# Patient Record
Sex: Female | Born: 1990
Health system: Southern US, Community
[De-identification: ages and names within clinical notes are randomized; demographics above are authoritative.]

## PROBLEM LIST (undated history)

## (undated) DIAGNOSIS — R002 Palpitations: Secondary | ICD-10-CM

## (undated) DIAGNOSIS — K219 Gastro-esophageal reflux disease without esophagitis: Secondary | ICD-10-CM

## (undated) DIAGNOSIS — T7840XA Allergy, unspecified, initial encounter: Secondary | ICD-10-CM

## (undated) DIAGNOSIS — Z0000 Encounter for general adult medical examination without abnormal findings: Secondary | ICD-10-CM

## (undated) DIAGNOSIS — A0811 Acute gastroenteropathy due to Norwalk agent: Secondary | ICD-10-CM

## (undated) DIAGNOSIS — E785 Hyperlipidemia, unspecified: Secondary | ICD-10-CM

## (undated) DIAGNOSIS — M791 Myalgia, unspecified site: Secondary | ICD-10-CM

## (undated) HISTORY — DX: Allergy, unspecified, initial encounter: T78.40XA

## (undated) HISTORY — DX: Myalgia, unspecified site: M79.10

## (undated) HISTORY — DX: Gastro-esophageal reflux disease without esophagitis: K21.9

## (undated) HISTORY — DX: Hyperlipidemia, unspecified: E78.5

## (undated) HISTORY — DX: Acute gastroenteropathy due to Norwalk agent: A08.11

## (undated) HISTORY — PX: WISDOM TOOTH EXTRACTION: SHX21

## (undated) HISTORY — DX: Encounter for general adult medical examination without abnormal findings: Z00.00

## (undated) HISTORY — DX: Palpitations: R00.2

---
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
## 2015-08-29 LAB — HM PAP SMEAR: HM Pap smear: NORMAL

## 2016-08-16 ENCOUNTER — Telehealth: Payer: Self-pay | Admitting: Behavioral Health

## 2016-08-16 ENCOUNTER — Encounter: Payer: Self-pay | Admitting: Behavioral Health

## 2016-08-16 NOTE — Telephone Encounter (Signed)
Pre-Visit Call completed with patient and chart updated.   Pre-Visit Info documented in Specialty Comments under Snap[employer]ot.    

## 2016-08-17 ENCOUNTER — Encounter: Payer: Self-pay | Admitting: Family Medicine

## 2016-08-17 ENCOUNTER — Telehealth: Payer: Self-pay | Admitting: Obstetrics & Gynecology

## 2016-08-17 ENCOUNTER — Ambulatory Visit (INDEPENDENT_AMBULATORY_CARE_PROVIDER_SITE_OTHER): Payer: 59 | Admitting: Family Medicine

## 2016-08-17 VITALS — BP 92/68 | HR 96 | Temp 98.9°F | Ht 73.0 in | Wt 184.2 lb

## 2016-08-17 DIAGNOSIS — Z309 Encounter for contraceptive management, unspecified: Secondary | ICD-10-CM | POA: Diagnosis not present

## 2016-08-17 DIAGNOSIS — M791 Myalgia, unspecified site: Secondary | ICD-10-CM | POA: Insufficient documentation

## 2016-08-17 DIAGNOSIS — E782 Mixed hyperlipidemia: Secondary | ICD-10-CM

## 2016-08-17 DIAGNOSIS — Z0000 Encounter for general adult medical examination without abnormal findings: Secondary | ICD-10-CM | POA: Insufficient documentation

## 2016-08-17 DIAGNOSIS — T7840XA Allergy, unspecified, initial encounter: Secondary | ICD-10-CM | POA: Diagnosis not present

## 2016-08-17 DIAGNOSIS — K219 Gastro-esophageal reflux disease without esophagitis: Secondary | ICD-10-CM

## 2016-08-17 HISTORY — DX: Allergy, unspecified, initial encounter: T78.40XA

## 2016-08-17 HISTORY — DX: Myalgia, unspecified site: M79.10

## 2016-08-17 HISTORY — DX: Encounter for general adult medical examination without abnormal findings: Z00.00

## 2016-08-17 HISTORY — DX: Gastro-esophageal reflux disease without esophagitis: K21.9

## 2016-08-17 LAB — COMPREHENSIVE METABOLIC PANEL
ALT: 26 U/L (ref 0–35)
AST: 25 U/L (ref 0–37)
Albumin: 4.5 g/dL (ref 3.5–5.2)
Alkaline Phosphatase: 46 U/L (ref 39–117)
BUN: 10 mg/dL (ref 6–23)
CO2: 29 mEq/L (ref 19–32)
Calcium: 9.9 mg/dL (ref 8.4–10.5)
Chloride: 105 mEq/L (ref 96–112)
Creatinine, Ser: 0.96 mg/dL (ref 0.40–1.20)
GFR: 74.2 mL/min (ref >60.00)
Glucose, Bld: 88 mg/dL (ref 70–99)
Potassium: 4.7 mEq/L (ref 3.5–5.1)
Sodium: 138 mEq/L (ref 135–145)
Total Bilirubin: 0.4 mg/dL (ref 0.2–1.2)
Total Protein: 7.7 g/dL (ref 6.0–8.3)

## 2016-08-17 LAB — LIPID PANEL
Cholesterol: 168 mg/dL (ref 0–200)
HDL: 57.9 mg/dL (ref >39.00)
LDL Cholesterol: 79 mg/dL (ref 0–99)
NonHDL: 110.32
Total CHOL/HDL Ratio: 3
Triglycerides: 156 mg/dL — ABNORMAL HIGH (ref 0.0–149.0)
VLDL: 31.2 mg/dL (ref 0.0–40.0)

## 2016-08-17 LAB — MAGNESIUM: Magnesium: 2.1 mg/dL (ref 1.5–2.5)

## 2016-08-17 LAB — CBC
HCT: 42.8 % (ref 36.0–46.0)
Hemoglobin: 14.9 g/dL (ref 12.0–15.0)
MCHC: 34.9 g/dL (ref 30.0–36.0)
MCV: 88.7 fl (ref 78.0–100.0)
Platelets: 317 10*3/uL (ref 150.0–400.0)
RBC: 4.83 Mil/uL (ref 3.87–5.11)
RDW: 12.8 % (ref 11.5–15.5)
WBC: 8.4 10*3/uL (ref 4.0–10.5)

## 2016-08-17 LAB — TSH: TSH: 1.83 u[IU]/mL (ref 0.35–4.50)

## 2016-08-17 NOTE — Assessment & Plan Note (Signed)
Check a magnesium level today

## 2016-08-17 NOTE — Assessment & Plan Note (Signed)
Uses Allegra most of the year. Then on a bad day will take a second daily. Uses Flonase and nasal saline prn. Will add Singulair as needed.

## 2016-08-17 NOTE — Progress Notes (Signed)
Patient ID: Jenna Rhodes, female   DOB: 04-03-1991, 25 y.o.   MRN: [id number]   Subjective:    Patient ID: Jenna Rhodes, female    DOB: [birth year], 25 y.o.   MRN: [id number]  Chief Complaint  Patient presents with  . Establish Care    HPI Patient is in today for new patient appointments and annual preventative exam. She is feeling well or acute illness. PMH is only remarkable for some seasonal allergies. Denies CP/palp/SOB/HA/congestion/fevers/GI or GU c/o. Taking meds as prescribed  Past Medical History:  Diagnosis Date  . Allergic state 08/17/2016  . Allergy    pt. reported seasonal allergies  . Encounter for screening and preventative care 08/17/2016  . Gastro-esophageal reflux 08/17/2016  . Hyperlipidemia 08/30/2016  . Myalgia 08/17/2016    Past Surgical History:  Procedure Laterality Date  . MANDIBLE SURGERY  08/28/2002    Family History  Problem Relation Age of Onset  . Parkinson's disease Father   . Diabetes Maternal Grandmother   . Cancer Maternal Grandmother     breast   . Hypertension Maternal Grandmother   . Cancer Paternal Grandmother     stomach, lymphoma  . Neuropathy Paternal Grandfather   . Cancer Paternal Grandfather     prostate  . Hearing loss Paternal Grandfather     s/p bypass    Social History   Social History  . Marital status: Married    Spouse name: N/A  . Number of children: N/A  . Years of education: N/A   Occupational History  . Not on file.   Social History Main Topics  . Smoking status: Never Smoker  . Smokeless tobacco: Never Used  . Alcohol use 0.6 oz/week    1 Glasses of wine per week  . Drug use: No  . Sexual activity: Yes    Partners: Male    Birth control/ protection: Pill   Other Topics Concern  . 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 DIGESTIVE ENZYMES PO Take by mouth.    . fexofenadine-pseudoephedrine (ALLEGRA-D 24) 180-240 MG 24 hr tablet Take 1 tablet by mouth daily.    . fluticasone (FLONASE) 50 MCG/ACT nasal spray Place into both nostrils as needed for allergies or rhinitis.    . Multiple Vitamins-Minerals (MULTIVITAMIN WITH MINERALS) tablet Take 1 tablet by mouth daily.    . norethindrone-ethinyl estradiol (JUNEL 1/20) 1-20 MG-MCG tablet Take 1 tablet by mouth daily.    . Probiotic Product (PROBIOTIC ADVANCED PO) Take by mouth daily.     No facility-administered medications prior to visit.     Allergies  Allergen Reactions  . Amoxicillin Rash  . Clindamycin/Lincomycin Rash  . Meloxicam Rash    Review of Systems  Constitutional: Negative for fever.  HENT: Negative for congestion.   Eyes: Negative for blurred vision.  Respiratory: Negative for cough.   Cardiovascular: Negative for chest pain and palpitations.  Gastrointestinal: Negative for vomiting.  Musculoskeletal: Negative for back pain.  Skin: Negative for rash.  Neurological: Negative for loss of consciousness and headaches.       Objective:    Physical Exam  Constitutional: She is oriented to person, place, and time. She appears well-developed and well-nourished. No distress.  HENT:  Head: Normocephalic and atraumatic.  Eyes: Conjunctivae are normal.  Neck: Normal range of motion. No thyromegaly present.  Cardiovascular: Normal rate and regular rhythm.   Pulmonary/Chest: Effort normal and breath sounds normal. She has no wheezes.  Abdominal: Soft. Bowel sounds are normal. She exhibits no mass. There is no tenderness.  Musculoskeletal: Normal range of motion. She exhibits no edema or deformity.  Neurological: She is alert and oriented to person, place, and time.  Skin: Skin is warm and dry. She is not diaphoretic.  Psychiatric: She has a normal mood and affect.    BP 92/68 (BP Location: Left Arm, Patient Position: Sitting, Cuff  Size: Normal)   Pulse 96   Temp 98.9 F (37.2 C) (Oral)   Ht 6\' 1"  (1.854 m)   Wt 184 lb 3.2 oz (83.6 kg)   LMP 07/27/2016 (Approximate)   SpO2 96%   BMI 24.30 kg/m  Wt Readings from Last 3 Encounters:  08/17/16 184 lb 3.2 oz (83.6 kg)     Lab Results  Component Value Date   WBC 8.4 08/17/2016   HGB 14.9 08/17/2016   HCT 42.8 08/17/2016   PLT 317.0 08/17/2016   GLUCOSE 88 08/17/2016   CHOL 168 08/17/2016   TRIG 156.0 (H) 08/17/2016   HDL 57.90 08/17/2016   LDLCALC 79 08/17/2016   ALT 26 08/17/2016   AST 25 08/17/2016   NA 138 08/17/2016   K 4.7 08/17/2016   CL 105 08/17/2016   CREATININE 0.96 08/17/2016   BUN 10 08/17/2016   CO2 29 08/17/2016   TSH 1.83 08/17/2016    Lab Results  Component Value Date   TSH 1.83 08/17/2016   Lab Results  Component Value Date   WBC 8.4 08/17/2016   HGB 14.9 08/17/2016   HCT 42.8 08/17/2016   MCV 88.7 08/17/2016   PLT 317.0 08/17/2016   Lab Results  Component Value Date   NA 138 08/17/2016   K 4.7 08/17/2016   CO2 29 08/17/2016   GLUCOSE 88 08/17/2016   BUN 10 08/17/2016   CREATININE 0.96 08/17/2016   BILITOT 0.4 08/17/2016   ALKPHOS 46 08/17/2016   AST 25 08/17/2016   ALT 26 08/17/2016   PROT 7.7 08/17/2016   ALBUMIN 4.5 08/17/2016   CALCIUM 9.9 08/17/2016   GFR 74.20 08/17/2016   Lab Results  Component Value Date   CHOL 168 08/17/2016   Lab Results  Component Value Date   HDL 57.90 08/17/2016   Lab Results  Component Value Date   LDLCALC 79 08/17/2016   Lab Results  Component Value Date   TRIG 156.0 (H) 08/17/2016   Lab Results  Component Value Date   CHOLHDL 3 08/17/2016   No results found for: HGBA1C    I acted as a Education administrator for Dr. Charlett Blake. Princess, RMA  Assessment & Plan:   Problem List Items Addressed This Visit    Allergic state    Uses Allegra most of the year. Then on a bad day will take a second daily. Uses Flonase and nasal saline prn. Will add Singulair as needed.       Myalgia      Check a magnesium level today      Relevant Orders   Magnesium (Completed)   Gastro-esophageal reflux   Encounter for screening and preventative care    Patient encouraged to maintain heart healthy diet, regular exercise, adequate sleep. Consider daily probiotics. Take medications as prescribed.       Relevant Orders   CBC (Completed)   Comprehensive metabolic panel (Completed)  TSH (Completed)   Lipid panel (Completed)   Hyperlipidemia    Encouraged heart healthy diet, increase exercise, avoid trans fats, consider a krill oil cap daily       Other Visit Diagnoses    Encounter for contraceptive management, unspecified type    -  Primary   Relevant Orders   Ambulatory referral to Obstetrics / Gynecology      I am having Ms. Tudor maintain her fexofenadine-pseudoephedrine, norethindrone-ethinyl estradiol, fluticasone, Probiotic Product (PROBIOTIC ADVANCED PO), multivitamin with minerals, and DIGESTIVE ENZYMES PO.  No orders of the defined types were placed in this encounter.  CMA served as Education administrator during this visit. History, Physical and Plan performed by medical provider. Documentation and orders reviewed and attested to.  Penni Homans, MD

## 2016-08-17 NOTE — Telephone Encounter (Signed)
Called and left a message for patient to call back to schedule a new patient doctor referral.    Dr Edwinna Areola, needs new patient appt for contraceptive management and establish care. Annual GYN exam due in summer.

## 2016-08-17 NOTE — Progress Notes (Signed)
Pre visit review using our clinic review tool, if applicable. No additional management support is needed unless otherwise documented below in the visit note. 

## 2016-08-17 NOTE — Patient Instructions (Addendum)
[place] [facility] [email or web address]  For colds and infections. Try Vitamin C 500  To 1000 mg daily.zinc daily. Probiotic NOW company 10 strain cap daily, [place], Aged or black garlic    [facility] Preventive Care 18-39 Years, Female Preventive care refers to lifestyle choices and visits with your health care provider that can promote health and wellness. What does preventive care include?  A yearly physical exam. This is also called an annual well check.  Dental exams once or twice a year.  Routine eye exams. Ask your health care provider how often you should have your eyes checked.  Personal lifestyle choices, including:  Daily care of your teeth and gums.  Regular physical activity.  Eating a healthy diet.  Avoiding tobacco and drug use.  Limiting alcohol use.  Practicing safe sex.  Taking vitamin and mineral supplements as recommended by your health care provider. What happens during an annual well check? The services and screenings done by your health care provider during your annual well check will depend on your age, overall health, lifestyle risk factors, and family history of disease. Counseling  Your health care provider may ask you questions about your:  Alcohol use.  Tobacco use.  Drug use.  Emotional well-being.  Home and relationship well-being.  Sexual activity.  Eating habits.  Work and work Statistician.  Method of birth control.  Menstrual cycle.  Pr  egnancy history. Screening  You may have the following tests or measurements:  Height, weight, and BMI.  Diabetes screening. This is done by checking your blood sugar (glucose) after you have not eaten for a while (fasting).  Blood pressure.  Lipid and cholesterol levels. These may be checked every 5 years starting at age 75.  Skin check.  Hepatitis C blood test.  Hepatitis B blood test.  Sexually transmitted disease (STD) testing.  BRCA-related cancer screening.  This may be done if you have a family history of breast, ovarian, tubal, or peritoneal cancers.  Pelvic exam and Pap test. This may be done every 3 years starting at age 16. Starting at age 87, this may be done every 5 years if you have a Pap test in combination with an HPV test. Discuss your test results, treatment options, and if necessary, the need for more tests with your health care provider. Vaccines  Your health care provider may recommend certain vaccines, such as:  Influenza vaccine. This is recommended every year.  Tetanus, diphtheria, and acellular pertussis (Tdap, Td) vaccine. You may need a Td booster every 10 years.  Varicella vaccine. You may need this if you have not been vaccinated.  HPV vaccine. If you are 74 or younger, you may need three doses over 6 months.  Measles, mumps, and rubella (MMR) vaccine. You may need at least one dose of MMR. You may also need a second dose.  Pneumococcal 13-valent conjugate (PCV13) vaccine. You may need this if you have certain conditions and were not previously vaccinated.  Pneumococcal polysaccharide (PPSV23) vaccine. You may need one or two doses if you smoke cigarettes or if you have certain conditions.  Meningococcal vaccine. One dose is recommended if you are age 26-21 years and a first-year college student living in a residence hall, or if you have one of several medical conditions. You may also need additional booster doses.  Hepatitis A vaccine. You may need this if you have certain conditions or if you travel or work in places where you may be exposed to hepatitis A.  Hepatitis B vaccine. You may need this if you have certain conditions or if you travel or work in places where you may be exposed to hepatitis B.  Haemophilus influenzae type b (Hib) vaccine. You may need this if you have certain risk factors. Talk to your health care provider about which screenings and vaccines you need and how often you need them. This  information is not intended to replace advice given to you by your health care provider. Make sure you discuss any questions you have with your health care provider. Document Released: 10/10/2001 Document Revised: 05/03/2016 Document Reviewed: 06/15/2015 Elsevier Interactive Patient Education  2017 [employer].

## 2016-08-30 ENCOUNTER — Encounter: Payer: Self-pay | Admitting: Family Medicine

## 2016-08-30 DIAGNOSIS — E785 Hyperlipidemia, unspecified: Secondary | ICD-10-CM

## 2016-08-30 HISTORY — DX: Hyperlipidemia, unspecified: E78.5

## 2016-08-30 NOTE — Assessment & Plan Note (Signed)
Encouraged heart healthy diet, increase exercise, avoid trans fats, consider a krill oil cap daily 

## 2016-08-30 NOTE — Assessment & Plan Note (Signed)
Patient encouraged to maintain heart healthy diet, regular exercise, adequate sleep. Consider daily probiotics. Take medications as prescribed 

## 2016-09-12 ENCOUNTER — Telehealth: Payer: Self-pay | Admitting: Family Medicine

## 2016-09-12 NOTE — Telephone Encounter (Signed)
Patient Name: Jenna Rhodes DOB: 1990-12-02 Initial Comment Caller is having heart flutters, shoulder pain, and jaw pain. Nurse Assessment Guidelines Guideline Title Affirmed Question Affirmed Notes Final Disposition User FINAL ATTEMPT MADE - no message left [place], Therapist, sports, Kermit Balo

## 2016-09-15 NOTE — Telephone Encounter (Signed)
Called to follow-up with the patient, regarding the below symptoms. She reported that for 2-3 days she had a really stiff neck, but since then her jaw & neck has loosened up. Currently, she denies chest pain, headache, dizziness, shortness of breath, numbness/tingling of limbs and etc. Patient voiced that overall she's doing better. An appointment has been scheduled for 09/21/16 at 8:15 AM with Dr. Charlett Blake. Advised patient to call the office, seek [facility] or ER for any worsening symptoms. She voiced understanding. No further questions or concerns before call ended.

## 2016-09-16 DIAGNOSIS — R079 Chest pain, unspecified: Secondary | ICD-10-CM | POA: Diagnosis not present

## 2016-09-16 DIAGNOSIS — Z880 Allergy status to penicillin: Secondary | ICD-10-CM | POA: Diagnosis not present

## 2016-09-16 DIAGNOSIS — R05 Cough: Secondary | ICD-10-CM | POA: Diagnosis not present

## 2016-09-16 DIAGNOSIS — R0602 Shortness of breath: Secondary | ICD-10-CM | POA: Diagnosis not present

## 2016-09-16 DIAGNOSIS — R002 Palpitations: Secondary | ICD-10-CM | POA: Diagnosis not present

## 2016-09-17 ENCOUNTER — Encounter: Payer: Self-pay | Admitting: Family Medicine

## 2016-09-20 DIAGNOSIS — R002 Palpitations: Secondary | ICD-10-CM | POA: Diagnosis not present

## 2016-09-21 ENCOUNTER — Encounter: Payer: Self-pay | Admitting: Family Medicine

## 2016-09-21 ENCOUNTER — Ambulatory Visit (INDEPENDENT_AMBULATORY_CARE_PROVIDER_SITE_OTHER): Payer: 59 | Admitting: Family Medicine

## 2016-09-21 DIAGNOSIS — F411 Generalized anxiety disorder: Secondary | ICD-10-CM | POA: Diagnosis not present

## 2016-09-21 DIAGNOSIS — R002 Palpitations: Secondary | ICD-10-CM

## 2016-09-21 HISTORY — DX: Palpitations: R00.2

## 2016-09-21 NOTE — Progress Notes (Signed)
Subjective:    Patient ID: Jenna Rhodes, female    DOB: 11-25-1990, 26 y.o.   MRN: [id number]  No chief complaint on file.   HPI Patient is in today for an acute visit. Patient has had complaints of heart fluttering, jaw pain and shoulder pain off and on for the past month; has increased periodically over the past two weeks. Patient states that she saw a Cardiologist yesterday, 09/20/2016. Was seen at [facility] ED on 09/17/2016. Her work up in Emergency Department was unremarkable. She is reassured and she does not have any palpitations today. Never had chest pain. She does note SOB occurred with the palpitations and she also endorses increased sense of anxiety during these episodes. No new or acute stressors.   Past Medical History:  Diagnosis Date  . Allergic state 08/17/2016  . Allergy    pt. reported seasonal allergies  . Encounter for screening and preventative care 08/17/2016  . Gastro-esophageal reflux 08/17/2016  . Hyperlipidemia 08/30/2016  . Myalgia 08/17/2016  . Palpitations 09/21/2016    Past Surgical History:  Procedure Laterality Date  . MANDIBLE SURGERY  08/28/2002    Family History  Problem Relation Age of Onset  . Parkinson's disease Father   . Diabetes Maternal Grandmother   . Cancer Maternal Grandmother     breast   . Hypertension Maternal Grandmother   . Cancer Paternal Grandmother     stomach, lymphoma  . Neuropathy Paternal Grandfather   . Cancer Paternal Grandfather     prostate  . Hearing loss Paternal Grandfather     s/p bypass    Social History   Social History  . Marital status: Married    Spouse name: N/A  . Number of children: N/A  . Years of education: N/A   Occupational History  . Not on file.   Social History Main Topics  . Smoking status: Never Smoker  . Smokeless tobacco: Never Used  . Alcohol use 0.6 oz/week    1 Glasses of wine per week  . Drug use: No  . Sexual activity: Yes    Partners: Male    Birth control/  protection: Pill   Other Topics Concern  . 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 DIGESTIVE ENZYMES PO Take by mouth.    . fexofenadine-pseudoephedrine (ALLEGRA-D 24) 180-240 MG 24 hr tablet Take 1 tablet by mouth daily.    . fluticasone (FLONASE) 50 MCG/ACT nasal spray Place into both nostrils as needed for allergies or rhinitis.    . Multiple Vitamins-Minerals (MULTIVITAMIN WITH MINERALS) tablet Take 1 tablet by mouth daily.    . Probiotic Product (PROBIOTIC ADVANCED PO) Take by mouth daily.    . norethindrone-ethinyl estradiol (JUNEL 1/20) 1-20 MG-MCG tablet Take 1 tablet by mouth daily.     No facility-administered medications prior to visit.     Allergies  Allergen Reactions  . Amoxicillin Rash  . Clindamycin/Lincomycin Rash  . Meloxicam Rash    Review of Systems  Constitutional: Negative for fever and malaise/fatigue.  HENT: Negative for congestion.   Eyes: Negative for blurred vision.  Respiratory: Negative for shortness of breath.   Cardiovascular: Positive for palpitations. Negative for chest pain and leg swelling.       Heart Flutters.  Gastrointestinal: Negative for abdominal  pain, blood in stool and nausea.  Genitourinary: Negative for dysuria and frequency.  Musculoskeletal: Positive for myalgias. Negative for falls and joint pain.       Jaw and shoulder pain.  Skin: Negative for rash.  Neurological: Negative for dizziness, loss of consciousness and headaches.  Endo/Heme/Allergies: Negative for environmental allergies.  Psychiatric/Behavioral: Negative for depression. The patient is not nervous/anxious.        Objective:    Physical Exam  Constitutional: She is oriented to person, place, and time. She appears well-developed and well-nourished. No distress.  HENT:  Head:  Normocephalic and atraumatic.  Nose: Nose normal.  Eyes: Right eye exhibits no discharge. Left eye exhibits no discharge.  Neck: Normal range of motion. Neck supple.  Cardiovascular: Normal rate and regular rhythm.   No murmur heard. Pulmonary/Chest: Effort normal and breath sounds normal.  Abdominal: Soft. Bowel sounds are normal. There is no tenderness.  Musculoskeletal: She exhibits no edema.  Neurological: She is alert and oriented to person, place, and time.  Skin: Skin is warm and dry.  Psychiatric: She has a normal mood and affect.  Nursing note and vitals reviewed.   BP 112/71 (BP Location: Left Arm, Patient Position: Sitting, Cuff Size: Normal)   Pulse 82   Temp 97.9 F (36.6 C) (Oral)   Wt 183 lb 6.4 oz (83.2 kg)   LMP 09/15/2016   SpO2 99% Comment: RA  BMI 24.20 kg/m  Wt Readings from Last 3 Encounters:  09/21/16 183 lb 6.4 oz (83.2 kg)  08/17/16 184 lb 3.2 oz (83.6 kg)     Lab Results  Component Value Date   WBC 8.4 08/17/2016   HGB 14.9 08/17/2016   HCT 42.8 08/17/2016   PLT 317.0 08/17/2016   GLUCOSE 88 08/17/2016   CHOL 168 08/17/2016   TRIG 156.0 (H) 08/17/2016   HDL 57.90 08/17/2016   LDLCALC 79 08/17/2016   ALT 26 08/17/2016   AST 25 08/17/2016   NA 138 08/17/2016   K 4.7 08/17/2016   CL 105 08/17/2016   CREATININE 0.96 08/17/2016   BUN 10 08/17/2016   CO2 29 08/17/2016   TSH 1.83 08/17/2016    Lab Results  Component Value Date   TSH 1.83 08/17/2016   Lab Results  Component Value Date   WBC 8.4 08/17/2016   HGB 14.9 08/17/2016   HCT 42.8 08/17/2016   MCV 88.7 08/17/2016   PLT 317.0 08/17/2016   Lab Results  Component Value Date   NA 138 08/17/2016   K 4.7 08/17/2016   CO2 29 08/17/2016   GLUCOSE 88 08/17/2016   BUN 10 08/17/2016   CREATININE 0.96 08/17/2016   BILITOT 0.4 08/17/2016   ALKPHOS 46 08/17/2016   AST 25 08/17/2016   ALT 26 08/17/2016   PROT 7.7 08/17/2016   ALBUMIN 4.5 08/17/2016   CALCIUM 9.9 08/17/2016   GFR  74.20 08/17/2016   Lab Results  Component Value Date   CHOL 168 08/17/2016   Lab Results  Component Value Date   HDL 57.90 08/17/2016   Lab Results  Component Value Date   LDLCALC 79 08/17/2016   Lab Results  Component Value Date   TRIG 156.0 (H) 08/17/2016   Lab Results  Component Value Date   CHOLHDL 3 08/17/2016   No results found for: HGBA1C    I acted as a Education administrator for Dr. Charlett Blake. [place]ord [place], [place]  Assessment & Plan:   Problem List Items Addressed This Visit    Palpitations  She denies any concerns today and she is pleased her work up with recent ER visit was unremarkable. She will seek care if symptoms worsen      Anxiety state    She is encouraged to avoid pseudoephedrine. She agrees her symptoms are at least partially contributed to by her anxiety. She declines any meds at this time but will seek care if symptoms worsen. Encouraged therapy           I have discontinued Ms. Aldous's norethindrone-ethinyl estradiol. I am also having her maintain her fexofenadine-pseudoephedrine, fluticasone, Probiotic Product (PROBIOTIC ADVANCED PO), multivitamin with minerals, and DIGESTIVE ENZYMES PO.  No orders of the defined types were placed in this encounter.   CMA served as Education administrator during this visit. History, Physical and Plan performed by medical provider. Documentation and orders reviewed and attested to.  Penni Homans, MD

## 2016-09-21 NOTE — Progress Notes (Signed)
Patient ID: Jenna Rhodes, female   DOB: 1991-07-07, 26 y.o.   MRN: [id number]

## 2016-09-21 NOTE — Patient Instructions (Addendum)
Encouraged to use both heat and ice to relax your muscles; alternating between the two. Then stretch after using the heat method for 10-15 minutes. Use Solanpas, Apercreme or [employer]. Encouraged to emcorporate the NOW Probiotic, aged garlic, zinc, black elderberry, and vitamin C in your daily intake/diet. Palpitations A palpitation is the feeling that your heartbeat is irregular or is faster than normal. It may feel like your heart is fluttering or skipping a beat. Palpitations are usually not a serious problem. They may be caused by many things, including smoking, caffeine, alcohol, stress, and certain medicines. Although most causes of palpitations are not serious, palpitations can be a sign of a serious medical problem. In some cases, you may need further medical evaluation. Follow these instructions at home: Pay attention to any changes in your symptoms. Take these actions to help with your condition:  Avoid the following:  Caffeinated coffee, tea, soft drinks, diet pills, and energy drinks.  Chocolate.  Alcohol.  Do not use any tobacco products, such as cigarettes, chewing tobacco, and e-cigarettes. If you need help quitting, ask your health care provider.  Try to reduce your stress and anxiety. Things that can help you relax include:  Yoga.  Meditation.  Physical activity, such as swimming, jogging, or walking.  Biofeedback. This is a method that helps you learn to use your mind to control things in your body, such as your heartbeats.  Get plenty of rest and sleep.  Take over-the-counter and prescription medicines only as told by your health care provider.  Keep all follow-up visits as told by your health care provider. This is important. Contact a health care provider if:  You continue to have a fast or irregular heartbeat after 24 hours.  Your palpitations occur more often. Get help right away if:  You have chest pain or shortness of breath.  You have a severe  headache.  You feel dizzy or you faint. This information is not intended to replace advice given to you by your health care provider. Make sure you discuss any questions you have with your health care provider. Document Released: 08/11/2000 Document Revised: 01/17/2016 Document Reviewed: 04/29/2015 Elsevier Interactive Patient Education  2017 [employer].

## 2016-09-21 NOTE — Progress Notes (Signed)
Pre visit review using our clinic review tool, if applicable. No additional management support is needed unless otherwise documented below in the visit note. 

## 2016-09-24 NOTE — Assessment & Plan Note (Signed)
She is encouraged to avoid pseudoephedrine. She agrees her symptoms are at least partially contributed to by her anxiety. She declines any meds at this time but will seek care if symptoms worsen. Encouraged therapy

## 2016-09-24 NOTE — Assessment & Plan Note (Signed)
She denies any concerns today and she is pleased her work up with recent ER visit was unremarkable. She will seek care if symptoms worsen

## 2016-10-03 DIAGNOSIS — R002 Palpitations: Secondary | ICD-10-CM | POA: Diagnosis not present

## 2016-10-31 DIAGNOSIS — M542 Cervicalgia: Secondary | ICD-10-CM | POA: Diagnosis not present

## 2016-10-31 DIAGNOSIS — M545 Low back pain: Secondary | ICD-10-CM | POA: Diagnosis not present

## 2016-10-31 DIAGNOSIS — M9901 Segmental and somatic dysfunction of cervical region: Secondary | ICD-10-CM | POA: Diagnosis not present

## 2016-11-01 DIAGNOSIS — R002 Palpitations: Secondary | ICD-10-CM | POA: Diagnosis not present

## 2016-11-06 DIAGNOSIS — R002 Palpitations: Secondary | ICD-10-CM | POA: Diagnosis not present

## 2016-12-07 ENCOUNTER — Ambulatory Visit (INDEPENDENT_AMBULATORY_CARE_PROVIDER_SITE_OTHER): Payer: 59 | Admitting: Obstetrics & Gynecology

## 2016-12-07 ENCOUNTER — Other Ambulatory Visit (HOSPITAL_COMMUNITY)
Admission: RE | Admit: 2016-12-07 | Discharge: 2016-12-07 | Disposition: A | Discharge status: Home / Self Care | Payer: 59 | Source: Ambulatory Visit | Attending: Obstetrics & Gynecology | Admitting: Obstetrics & Gynecology

## 2016-12-07 ENCOUNTER — Encounter: Payer: Self-pay | Admitting: Obstetrics & Gynecology

## 2016-12-07 VITALS — BP 108/64 | HR 68 | Resp 18 | Ht 72.5 in | Wt 183.0 lb

## 2016-12-07 DIAGNOSIS — Z01419 Encounter for gynecological examination (general) (routine) without abnormal findings: Secondary | ICD-10-CM | POA: Diagnosis not present

## 2016-12-07 DIAGNOSIS — Z124 Encounter for screening for malignant neoplasm of cervix: Secondary | ICD-10-CM

## 2016-12-07 DIAGNOSIS — Z01411 Encounter for gynecological examination (general) (routine) with abnormal findings: Secondary | ICD-10-CM | POA: Insufficient documentation

## 2016-12-07 DIAGNOSIS — R87613 High grade squamous intraepithelial lesion on cytologic smear of cervix (HGSIL): Secondary | ICD-10-CM | POA: Diagnosis not present

## 2016-12-07 NOTE — Progress Notes (Addendum)
Patient ID: Jenna Rhodes, female   DOB: [birth year], 26 y.o.   MRN: [id number]   26 y.o. G0P0000 MarriedCaucasianF here for annual exam.  Establishing care.  Stopped taking birth control in January.  Has been married for last five months.  Considering starting a family in the near future.  Cycles are regular.  Flow lasts for four.  Flow is heaviest on the first day or two.   Reports having some palpitations that she now feels was due to coming off the birth control.  Had echo and wore and event monitor for a month.  No need for treatment right now.  She had follow up one month.  PCP:  Dr. Randel Pigg  Patient's last menstrual period was 12/02/2016 (exact date).          Sexually active: Yes.    The current method of family planning is condoms all the time.    Exercising: Yes.    Gym/ health club routine includes yoga, cardio and weights 3 days per week. Smoker:  no  Health Maintenance: Pap: 08/09/15, Negative (Care Everywhere, [facility]) History of abnormal Pap:  no TDaP:  Unsure when last given Pneumonia vaccine(s): Not indicated due to age Zostavax:   Not indicated due to age Hep C testing: Not indicated due to age Screening Labs: 08/17/16 in EPIC, Hb today: declined   reports that she has never smoked. She has never used smokeless tobacco. She reports that she drinks about 1.2 oz of alcohol per week . She reports that she does not use drugs.  Past Medical History:  Diagnosis Date  . Allergic state 08/17/2016  . Allergy    pt. reported seasonal allergies  . Encounter for screening and preventative care 08/17/2016  . Gastro-esophageal reflux 08/17/2016  . Hyperlipidemia 08/30/2016  . Myalgia 08/17/2016  . Palpitations 09/21/2016    Past Surgical History:  Procedure Laterality Date  . MANDIBLE SURGERY  08/28/2002    Current Outpatient Prescriptions  Medication Sig Dispense Refill  . DIGESTIVE ENZYMES PO Take by mouth.    . fexofenadine (ALLEGRA) 180 MG tablet Take 1 tablet by  mouth daily.    . fluticasone (FLONASE) 50 MCG/ACT nasal spray Place into both nostrils as needed for allergies or rhinitis.    . Multiple Vitamins-Minerals (MULTIVITAMIN WITH MINERALS) tablet Take 1 tablet by mouth daily.    . Probiotic Product (PROBIOTIC ADVANCED PO) Take by mouth daily.     No current facility-administered medications for this visit.     Family History  Problem Relation Age of Onset  . Parkinson's disease Father   . Diabetes Maternal Grandmother   . Cancer Maternal Grandmother     breast   . Hypertension Maternal Grandmother   . Breast cancer Maternal Grandmother   . Cancer Paternal Grandmother     stomach, lymphoma  . Neuropathy Paternal Grandfather   . Cancer Paternal Grandfather     prostate  . Hearing loss Paternal Grandfather     s/p bypass    ROS:  Pertinent items are noted in HPI.  Otherwise, a comprehensive ROS was negative.  Exam:   BP 108/64 (BP Location: Right Arm, Patient Position: Sitting, Cuff Size: Normal)   Pulse 68   Resp 18   Ht 6' 0.5" (1.842 m)   Wt 183 lb (83 kg)   LMP 12/02/2016 (Exact Date)   BMI 24.48 kg/m     Height: 6' 0.5" (184.2 cm)  Ht Readings from Last 3 Encounters:  12/07/16 6' 0.5" (1.842  m)  08/17/16 6\' 1"  (1.854 m)    General appearance: alert, cooperative and appears stated age Head: Normocephalic, without obvious abnormality, atraumatic Neck: no adenopathy, supple, symmetrical, trachea midline and thyroid normal to inspection and palpation Lungs: clear to auscultation bilaterally Breasts: normal appearance, no masses or tenderness Heart: regular rate and rhythm Abdomen: soft, non-tender; bowel sounds normal; no masses,  no organomegaly Extremities: extremities normal, atraumatic, no cyanosis or edema Skin: Skin color, texture, turgor normal. No rashes or lesions Lymph nodes: Cervical, supraclavicular, and axillary nodes normal. No abnormal inguinal nodes palpated Neurologic: Grossly normal   Pelvic:  External genitalia:  no lesions              Urethra:  normal appearing urethra with no masses, tenderness or lesions              Bartholins and Skenes: normal                 Vagina: normal appearing vagina with normal color and discharge, no lesions              Cervix: no lesions              Pap taken: Yes.   Bimanual Exam:  Uterus:  normal size, contour, position, consistency, mobility, non-tender              Adnexa: normal adnexa and no mass, fullness, tenderness               Rectovaginal: Confirms               Anus:  normal sphincter tone, no lesions  Chaperone was present for exam.  A:  Well Woman with normal exam Married, contemplating pregnancy Unsure about last Tdap  P:   Mammogram guidelines reviewed pap smear obtained today Shot records signed for today.  Will try to copy of this so I can advise when next Tdap is due. Advised to transition to PNV when starts to actively think about pregnancy due to increased folic acid doses.  Return annually or prn  Addendum:  12/12/17.  Last Tdap 9/04.  Pt will be notified this is due.

## 2016-12-12 LAB — CYTOLOGY - PAP

## 2016-12-13 ENCOUNTER — Telehealth: Payer: Self-pay | Admitting: *Deleted

## 2016-12-13 DIAGNOSIS — R87611 Atypical squamous cells cannot exclude high grade squamous intraepithelial lesion on cytologic smear of cervix (ASC-H): Secondary | ICD-10-CM

## 2016-12-13 NOTE — Telephone Encounter (Signed)
Spoke with patient, advised as seen below per Dr. Sabra Heck. Patient confirmed regular condom use. Patient scheduled for colpo on 12/22/16 at 10am, patient declined earlier appointments offered. Advised patient to take Motrin 800 mg with food and water one hour before procedure. Advised patient not required to bring someone to visit, but recommended if she has someone that could come that would be best. Patient verbalizes understanding and is agreeable.   Order placed for colposcopy.  Routing to provider for final review. Patient is agreeable to disposition. Will close encounter.   Cc: Theresia Lo

## 2016-12-13 NOTE — Telephone Encounter (Signed)
She is still using condoms regularly.  Ok to go ahead and schedule colposcopy. Thanks.

## 2016-12-13 NOTE — Telephone Encounter (Signed)
-----   Message from Megan Salon, MD sent at 12/12/2016  5:08 PM EDT ----- Patient has ASCUS H pap smear.  Needs colposcopy.  CC:  Karmen Bongo

## 2016-12-13 NOTE — Telephone Encounter (Signed)
Spoke with patient, advised patient pap was abnormal, Dr. Sabra Heck recommends further evaluation with colposcopy. Brief explanation of colpo procedure reviewed with patient, questions answered. No current contraceptive, advised patient to return call on first day of menses to schedule colpo, LMP 4/5. Also advised patient of Dr. Ammie Ferrier tdap recommendations as seen below, patient will plan to get tdap when she comes in for colpo. Patient verbalizes understanding and is agreeable.     Jenna Salon, MD  [facility] Triage Pool        Please let pt know I got her shot records. Last tdap was 2004. She is due. She can return for it or get it next OV. If waits, she needs to know if gets cut on anything outside, she should go and get an update. Please put in phone note.

## 2016-12-15 ENCOUNTER — Telehealth: Payer: Self-pay | Admitting: Obstetrics & Gynecology

## 2016-12-15 NOTE — Telephone Encounter (Signed)
Called patient to review benefits for procedure. Left voicemail to call back and review. °

## 2016-12-22 ENCOUNTER — Ambulatory Visit (INDEPENDENT_AMBULATORY_CARE_PROVIDER_SITE_OTHER): Payer: 59 | Admitting: Obstetrics & Gynecology

## 2016-12-22 VITALS — BP 108/70 | HR 80 | Resp 14 | Ht 72.5 in | Wt 186.0 lb

## 2016-12-22 DIAGNOSIS — R87611 Atypical squamous cells cannot exclude high grade squamous intraepithelial lesion on cytologic smear of cervix (ASC-H): Secondary | ICD-10-CM | POA: Diagnosis not present

## 2016-12-22 DIAGNOSIS — N870 Mild cervical dysplasia: Secondary | ICD-10-CM | POA: Diagnosis not present

## 2016-12-22 DIAGNOSIS — Z23 Encounter for immunization: Secondary | ICD-10-CM

## 2016-12-22 DIAGNOSIS — Z01812 Encounter for preprocedural laboratory examination: Secondary | ICD-10-CM

## 2016-12-22 LAB — POCT URINE PREGNANCY: Preg Test, Ur: NEGATIVE

## 2016-12-22 NOTE — Progress Notes (Signed)
Patient ID: Jenna Rhodes, female   DOB: 01-Jul-1991, 26 y.o.   MRN: [id number]   26 y.o. Married Not Hispanic or Latino female here for colposcopy with possible biopsies and/or ECC due to ASCUS-H Pap obtained at [facility] on 12/07/16.    Prior evaluation/treatment:  none.  Patient's last menstrual period was 12/02/2016 (exact date).          Sexually active: Yes.    The current method of family planning is none.     Patient has been counseled about results and procedure.  Risks and benefits have bene reviewed including immediate and/or delayed bleeding, infection, cervical scaring from procedure, possibility of needing additional follow up as well as treatment.  rare risks of missing a lesion discussed as well.  All questions answered.  Pt ready to proceed.  BP 108/70 (BP Location: Right Arm, Patient Position: Sitting, Cuff Size: Normal)   Pulse 80   Resp 14   Ht 6' 0.5" (1.842 m)   Wt 186 lb (84.4 kg)   LMP 12/02/2016 (Exact Date)   BMI 24.88 kg/m   Physical Exam  Genitourinary:      Speculum placed.  3% acetic acid applied to cervix for >45 seconds.  Cervix visualized with both 7.5X and 15X magnification.  Green filter also used.  Lugols solution was used.  Findings:  AWE and decreased staining at 11-1.  Biopsy:  Two obtained side by side at 12 and 1 o'clock.  ECC:  was performed.  Monsel's was not needed.  Excellent hemostasis was present.  Pt tolerated procedure well and all instruments were removed.  Findings noted above on picture of cervix.  Assessment:  ASCUS-H pap  Plan:  Pathology results will be called to patient and follow-up planned pending results.

## 2016-12-22 NOTE — Patient Instructions (Signed)

## 2016-12-22 NOTE — Progress Notes (Deleted)
GYNECOLOGY  VISIT   HPI: 26 y.o. G54P0000 Married Caucasian female here for ***.  GYNECOLOGIC HISTORY: Patient's last menstrual period was 12/02/2016 (exact date). Contraception: condoms  Menopausal hormone therapy: none  Patient Active Problem List   Diagnosis Date Noted  . Palpitations 09/21/2016  . Anxiety state 09/21/2016  . Hyperlipidemia 08/30/2016  . Allergic state 08/17/2016  . Myalgia 08/17/2016  . Gastro-esophageal reflux 08/17/2016  . Encounter for screening and preventative care 08/17/2016    Past Medical History:  Diagnosis Date  . Allergic state 08/17/2016  . Allergy    pt. reported seasonal allergies  . Encounter for screening and preventative care 08/17/2016  . Gastro-esophageal reflux 08/17/2016  . Hyperlipidemia 08/30/2016  . Myalgia 08/17/2016  . Palpitations 09/21/2016    Past Surgical History:  Procedure Laterality Date  . MANDIBLE SURGERY  08/28/2002    MEDS:  Reviewed in EPIC and UTD  ALLERGIES: Amoxicillin; Clindamycin/lincomycin; and Meloxicam  Family History  Problem Relation Age of Onset  . Parkinson's disease Father   . Diabetes Maternal Grandmother   . Cancer Maternal Grandmother     breast   . Hypertension Maternal Grandmother   . Breast cancer Maternal Grandmother   . Cancer Paternal Grandmother     stomach, lymphoma  . Neuropathy Paternal Grandfather   . Cancer Paternal Grandfather     prostate  . Hearing loss Paternal Grandfather     s/p bypass    SH:  ***  Review of Systems  All other systems reviewed and are negative.   PHYSICAL EXAMINATION:    BP 108/70 (BP Location: Right Arm, Patient Position: Sitting, Cuff Size: Normal)   Pulse 80   Resp 14   Ht 6' 0.5" (1.842 m)   Wt 186 lb (84.4 kg)   LMP 12/02/2016 (Exact Date)   BMI 24.88 kg/m     General appearance: alert, cooperative and appears stated age Neck: no adenopathy, supple, symmetrical, trachea midline and thyroid {CHL AMB PHY EX THYROID NORM  DEFAULT:[id number]::"normal to inspection and palpation"} CV:  {Exam; heart brief:31539} Lungs:  {pe lungs ob:314451::"clear to auscultation, no wheezes, rales or rhonchi, symmetric air entry"} Breasts: {Exam; breast:13139::"normal appearance, no masses or tenderness"} Abdomen: soft, non-tender; bowel sounds normal; no masses,  no organomegaly  Pelvic: External genitalia:  no lesions              Urethra:  normal appearing urethra with no masses, tenderness or lesions              Bartholins and Skenes: normal                 Vagina: normal appearing vagina with normal color and discharge, no lesions              Cervix: {CHL AMB PHY EX CERVIX NORM DEFAULT:[id number]::"no lesions"}              Bimanual Exam:  Uterus:  {CHL AMB PHY EX UTERUS NORM DEFAULT:[phone]::"normal size, contour, position, consistency, mobility, non-tender"}              Adnexa: {CHL AMB PHY EX ADNEXA NO MASS DEFAULT:[phone]::"no mass, fullness, tenderness"}              Rectovaginal: {yes no:314532}.  Confirms.              Anus:  normal sphincter tone, no lesions  Chaperone was present for exam.  Assessment: ***  Plan: ***   ~{NUMBERS; -10-45 JOINT ROM:10287} minutes spent with patient >  50% of time was in face to face discussion of above.

## 2016-12-28 ENCOUNTER — Encounter: Payer: Self-pay | Admitting: Obstetrics & Gynecology

## 2016-12-28 ENCOUNTER — Telehealth: Payer: Self-pay | Admitting: *Deleted

## 2016-12-28 NOTE — Telephone Encounter (Signed)
Dr. Sabra Heck, can you review colposcopy results dated 4/27 and advise?   From [place] Peed To Megan Salon, MD Sent 12/28/2016 3:32 PM  Hi Dr. Sabra Heck,   Do you have the results to my Coloscopy? It was done on Friday, April 27th. I have not received a follow-up phone call.   Jenna Rhodes

## 2016-12-28 NOTE — Telephone Encounter (Signed)
Pt called.  Pap was ASCUS H and biopsy showed only CIN 1.  There was only on lesion on colposcopy.  Correlation was not performed but requested (for pathology to reread).  Dr. Lyndon Code reread this and his opinion was that there was no actual high grade on the pap.  Pt advised of all of this.  [facility] appt changed to 4/19.  Pap and HR HPV will be repeated then.  Pt advised to proceed with pregnancy as desired.  Pt voiced understanding and appreciative of phone call.

## 2016-12-28 NOTE — Telephone Encounter (Signed)
See telephone encounter dated 12/28/16 for review with provider.

## 2017-01-18 ENCOUNTER — Ambulatory Visit: Payer: 59 | Admitting: Family Medicine

## 2017-08-13 ENCOUNTER — Encounter: Payer: 59 | Admitting: Family Medicine

## 2017-08-20 ENCOUNTER — Encounter: Payer: 59 | Admitting: Family Medicine

## 2017-09-06 ENCOUNTER — Ambulatory Visit: Payer: Self-pay | Admitting: *Deleted

## 2017-09-06 NOTE — Telephone Encounter (Signed)
Pt called complaining of abdominal pain that woke her up at about 0400 and she had to leave work today; she states that it [place] it felt like constipation and she has had several bowel movements with no relief; the pain is on top right side  Of her stomach and sometimes there is apinch on bottom right side of stomach when she walks; pt states that she has not taken her temperature but she has been having chills; recommendation made per nurse triage that pt see physician within 24 hours; spoke with Rod Hol[facility] at [facility] and pt is offered and accepted appointment with Dr Randel Pigg 09/07/17 at 0915; pt verbalizes understanding.  Reason for Disposition . [1] MODERATE pain (e.g., interferes with normal activities) AND [2] pain comes and goes (cramps) AND [3] present > 24 hours  (Exception: pain with Vomiting or Diarrhea - see that Guideline)  Answer Assessment - Initial Assessment Questions 1. LOCATION: "Where does it hurt?"      Top Right side of abdoman 2. RADIATION: "Does the pain shoot anywhere else?" (e.g., chest, back)     no 3. ONSET: "When did the pain begin?" (e.g., minutes, hours or days ago)      09/06/17 at 0400 4. SUDDEN: "Gradual or sudden onset?"     suddenly 5. PATTERN "Does the pain come and go, or is it constant?"    - If constant: "Is it getting better, staying the same, or worsening?"      (Note: Constant means the pain never goes away completely; most serious pain is constant and it progresses)     - If intermittent: "How long does it last?" "Do you have pain now?"     (Note: Intermittent means the pain goes away completely between bouts)     Constant; will be bothersome and comes in waves but never goes away completely 6. SEVERITY: "How bad is the pain?"  (e.g., Scale 1-10; mild, moderate, or severe)   - MILD (1-3): doesn't interfere with normal activities, abdomen soft and not tender to touch    - MODERATE (4-7): interferes with normal activities or awakens from sleep, tender  to touch    - SEVERE (8-10): excruciating pain, doubled over, unable to do any normal activities      moderate 7. RECURRENT SYMPTOM: "Have you ever had this type of abdominal pain before?" If so, ask: "When was the last time?" and "What happened that time?"      no 8. CAUSE: "What do you think is causing the abdominal pain?"     unsure 9. RELIEVING/AGGRAVATING FACTORS: "What makes it better or worse?" (e.g., movement, antacids, bowel movement)     Movement makes it worse; BM has helped some; ginger tea, probiotics 10. OTHER SYMPTOMS: "Has there been any vomiting, diarrhea, constipation, or urine problems?"       no 11. PREGNANCY: "Is there any chance you are pregnant?" "When was your last menstrual period?"       No LMP 08/21/17  Protocols used: ABDOMINAL PAIN - [facility]

## 2017-09-07 ENCOUNTER — Encounter: Payer: Self-pay | Admitting: Family Medicine

## 2017-09-07 ENCOUNTER — Ambulatory Visit (INDEPENDENT_AMBULATORY_CARE_PROVIDER_SITE_OTHER): Payer: 59 | Admitting: Family Medicine

## 2017-09-07 DIAGNOSIS — R1033 Periumbilical pain: Secondary | ICD-10-CM

## 2017-09-07 DIAGNOSIS — R109 Unspecified abdominal pain: Secondary | ICD-10-CM | POA: Insufficient documentation

## 2017-09-07 DIAGNOSIS — K529 Noninfective gastroenteritis and colitis, unspecified: Secondary | ICD-10-CM

## 2017-09-07 LAB — COMPREHENSIVE METABOLIC PANEL
ALT: 12 U/L (ref 0–35)
AST: 16 U/L (ref 0–37)
Albumin: 4.5 g/dL (ref 3.5–5.2)
Alkaline Phosphatase: 65 U/L (ref 39–117)
BUN: 13 mg/dL (ref 6–23)
CO2: 27 mEq/L (ref 19–32)
Calcium: 9.8 mg/dL (ref 8.4–10.5)
Chloride: 101 mEq/L (ref 96–112)
Creatinine, Ser: 0.87 mg/dL (ref 0.40–1.20)
GFR: 82.48 mL/min (ref >60.00)
Glucose, Bld: 81 mg/dL (ref 70–99)
Potassium: 3.9 mEq/L (ref 3.5–5.1)
Sodium: 135 mEq/L (ref 135–145)
Total Bilirubin: 1.1 mg/dL (ref 0.2–1.2)
Total Protein: 7.9 g/dL (ref 6.0–8.3)

## 2017-09-07 LAB — CBC WITH DIFFERENTIAL/PLATELET
Basophils Absolute: 0 10*3/uL (ref 0.0–0.1)
Basophils Relative: 0.3 % (ref 0.0–3.0)
Eosinophils Absolute: 0.1 10*3/uL (ref 0.0–0.7)
Eosinophils Relative: 0.8 % (ref 0.0–5.0)
HCT: 42.8 % (ref 36.0–46.0)
Hemoglobin: 14.1 g/dL (ref 12.0–15.0)
Lymphocytes Relative: 16.7 % (ref 12.0–46.0)
Lymphs Abs: 2.3 10*3/uL (ref 0.7–4.0)
MCHC: 32.9 g/dL (ref 30.0–36.0)
MCV: 92 fl (ref 78.0–100.0)
Monocytes Absolute: 0.7 10*3/uL (ref 0.1–1.0)
Monocytes Relative: 4.9 % (ref 3.0–12.0)
Neutro Abs: 10.5 10*3/uL — ABNORMAL HIGH (ref 1.4–7.7)
Neutrophils Relative %: 77.3 % — ABNORMAL HIGH (ref 43.0–77.0)
Platelets: 302 10*3/uL (ref 150.0–400.0)
RBC: 4.66 Mil/uL (ref 3.87–5.11)
RDW: 13.1 % (ref 11.5–15.5)
WBC: 13.6 10*3/uL — ABNORMAL HIGH (ref 4.0–10.5)

## 2017-09-07 NOTE — Patient Instructions (Signed)

## 2017-09-07 NOTE — Assessment & Plan Note (Addendum)
Was awoken from sleep 2 nights ago with periumbilical to right flank pain at 4 am. Had a normal but small BM. Had trouble falling back to sleep because she still had some discomfort. Was able to work 1/2 day but pain with walking still so went home. Took a nap and then was hungry for dinner so had some soup. Had to sleep sitting up but feels better this morning. Has continued to have BMs throughout episode.. Usually has 2-3 large movements now having 6+ of smaller but formed movements. No concerning family history. Does eat gluten or dairy free.

## 2017-09-07 NOTE — Progress Notes (Signed)
Subjective:  I acted as a Education administrator for [employer]. Jenna Rhodes, [place]   Patient ID: Jenna Rhodes, female    DOB: June 11, 1991, 27 y.o.   MRN: [id number]  Chief Complaint  Patient presents with  . Follow-up    HPI  Patient is in today for follow up visit and she is struggling with 48 hours of abdominal pain, nausea, anorexia, increased stool frequency. Increasing from 2-3 BMs daily to 6 or more over past 48 hours. No fevers or chills. She feels better today. Still has some discomfort but is much improved. Denies CP/palp/SOB/HA/congestion/fevers or GU c/o. Taking meds as prescribed  Patient Care Team: Mosie Lukes, MD as PCP - General (Family Medicine)   Past Medical History:  Diagnosis Date  . Allergic state 08/17/2016  . Allergy    pt. reported seasonal allergies  . Encounter for screening and preventative care 08/17/2016  . Gastro-esophageal reflux 08/17/2016  . Hyperlipidemia 08/30/2016  . Myalgia 08/17/2016  . Palpitations 09/21/2016    Past Surgical History:  Procedure Laterality Date  . MANDIBLE SURGERY  08/28/2002    Family History  Problem Relation Age of Onset  . Parkinson's disease Father   . Diabetes Maternal Grandmother   . Cancer Maternal Grandmother        breast   . Hypertension Maternal Grandmother   . Breast cancer Maternal Grandmother   . Cancer Paternal Grandmother        stomach, lymphoma  . Neuropathy Paternal Grandfather   . Cancer Paternal Grandfather        prostate  . Hearing loss Paternal Grandfather        s/p bypass    Social History   Socioeconomic History  . Marital status: Married    Spouse name: Not on file  . Number of children: Not on file  . Years of education: Not on file  . Highest education level: Not on file  Social Needs  . Financial resource strain: Not on file  . Food insecurity - worry: Not on file  . Food insecurity - inability: Not on file  . Transportation needs - medical: Not on file  . Transportation needs -  non-medical: Not on file  Occupational History  . Not on file  Tobacco Use  . Smoking status: Never Smoker  . Smokeless tobacco: Never Used  Substance and Sexual Activity  . Alcohol use: Yes    Alcohol/week: 1.2 oz    Types: 2 Glasses of wine per week  . Drug use: No  . Sexual activity: Yes    Partners: Male    Birth control/protection: Condom  Other Topics Concern  . 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 DIGESTIVE ENZYMES PO Take by mouth.    . fexofenadine (ALLEGRA) 180 MG tablet Take 1 tablet by mouth daily.    . fluticasone (FLONASE) 50 MCG/ACT nasal spray Place into both nostrils as needed for allergies or rhinitis.    Marland Kitchen ibuprofen (ADVIL,MOTRIN) 200 MG tablet Take 200 mg by mouth as needed.    . Multiple Vitamins-Minerals (MULTIVITAMIN WITH MINERALS) tablet Take 1 tablet by mouth daily.    . Probiotic Product (PROBIOTIC ADVANCED PO) Take by mouth daily.     No facility-administered medications prior to visit.     Allergies  Allergen  Reactions  . Amoxicillin Rash  . Clindamycin/Lincomycin Rash  . Meloxicam Rash    Review of Systems  Constitutional: Negative for fever and malaise/fatigue.  HENT: Negative for congestion.   Eyes: Negative for blurred vision.  Respiratory: Negative for shortness of breath.   Cardiovascular: Negative for chest pain, palpitations and leg swelling.  Gastrointestinal: Positive for abdominal pain, diarrhea and nausea. Negative for blood in stool, constipation, melena and vomiting.  Genitourinary: Negative for dysuria and frequency.  Musculoskeletal: Negative for falls.  Skin: Negative for rash.  Neurological: Negative for dizziness, loss of consciousness and headaches.  Endo/Heme/Allergies: Negative for environmental allergies.  Psychiatric/Behavioral:  Negative for depression. The patient is not nervous/anxious.        Objective:    Physical Exam  Constitutional: She is oriented to person, place, and time. She appears well-developed and well-nourished. No distress.  HENT:  Head: Normocephalic and atraumatic.  Nose: Nose normal.  Eyes: Right eye exhibits no discharge. Left eye exhibits no discharge.  Neck: Normal range of motion. Neck supple.  Cardiovascular: Normal rate and regular rhythm.  No murmur heard. Pulmonary/Chest: Effort normal and breath sounds normal.  Abdominal: Soft. Bowel sounds are normal. She exhibits no distension and no mass. There is tenderness. There is no rebound and no guarding.  Musculoskeletal: She exhibits no edema.  Neurological: She is alert and oriented to person, place, and time.  Skin: Skin is warm and dry.  Psychiatric: She has a normal mood and affect.  Nursing note and vitals reviewed.   BP 120/74 (BP Location: Left Arm, Patient Position: Sitting, Cuff Size: Normal)   Pulse 82   Temp 98.5 F (36.9 C) (Oral)   Ht 6\' 1"  (1.854 m)   Wt 172 lb 6.4 oz (78.2 kg)   SpO2 98%   BMI 22.75 kg/m  Wt Readings from Last 3 Encounters:  09/07/17 172 lb 6.4 oz (78.2 kg)  12/22/16 186 lb (84.4 kg)  12/07/16 183 lb (83 kg)   BP Readings from Last 3 Encounters:  09/07/17 120/74  12/22/16 108/70  12/07/16 108/64     Immunization History  Administered Date(s) Administered  . Tdap 12/22/2016    Health Maintenance  Topic Date Due  . HIV Screening  10/23/2004  . INFLUENZA VACCINE  05/10/2018 (Originally 03/28/2017)  . PAP SMEAR  12/08/2019  . TETANUS/TDAP  12/23/2026    Lab Results  Component Value Date   WBC 13.6 (H) 09/07/2017   HGB 14.1 09/07/2017   HCT 42.8 09/07/2017   PLT 302.0 09/07/2017   GLUCOSE 81 09/07/2017   CHOL 168 08/17/2016   TRIG 156.0 (H) 08/17/2016   HDL 57.90 08/17/2016   LDLCALC 79 08/17/2016   ALT 12 09/07/2017   AST 16 09/07/2017   NA 135 09/07/2017   K 3.9  09/07/2017   CL 101 09/07/2017   CREATININE 0.87 09/07/2017   BUN 13 09/07/2017   CO2 27 09/07/2017   TSH 1.83 08/17/2016    Lab Results  Component Value Date   TSH 1.83 08/17/2016   Lab Results  Component Value Date   WBC 13.6 (H) 09/07/2017   HGB 14.1 09/07/2017   HCT 42.8 09/07/2017   MCV 92.0 09/07/2017   PLT 302.0 09/07/2017   Lab Results  Component Value Date   NA 135 09/07/2017   K 3.9 09/07/2017   CO2 27 09/07/2017   GLUCOSE 81 09/07/2017   BUN 13 09/07/2017   CREATININE 0.87 09/07/2017   BILITOT 1.1 09/07/2017   ALKPHOS  65 09/07/2017   AST 16 09/07/2017   ALT 12 09/07/2017   PROT 7.9 09/07/2017   ALBUMIN 4.5 09/07/2017   CALCIUM 9.8 09/07/2017   GFR 82.48 09/07/2017   Lab Results  Component Value Date   CHOL 168 08/17/2016   Lab Results  Component Value Date   HDL 57.90 08/17/2016   Lab Results  Component Value Date   LDLCALC 79 08/17/2016   Lab Results  Component Value Date   TRIG 156.0 (H) 08/17/2016   Lab Results  Component Value Date   CHOLHDL 3 08/17/2016   No results found for: HGBA1C       Assessment & Plan:   Problem List Items Addressed This Visit    Abdominal pain    Was awoken from sleep 2 nights ago with periumbilical to right flank pain at 4 am. Had a normal but small BM. Had trouble falling back to sleep because she still had some discomfort. Was able to work 1/2 day but pain with walking still so went home. Took a nap and then was hungry for dinner so had some soup. Had to sleep sitting up but feels better this morning. Has continued to have BMs throughout use. Usually has 2-3 large movements now having 6+ of smaller but formed movements. No concerning family history. Does eat gluten or dairy free.      Relevant Orders   CBC w/Diff (Completed)   Comprehensive metabolic panel (Completed)   Gastroenteritis    For roughly 48 hours was experiencing abdominal pain and anorexia. Feels somewhat better today and has managed to  eat a little bland food today. No fevers or chills noted. Likely has a resolving Norovirus. She will report any concerning symptoms. Maintain clear fluids and bland diet for next 24 hours. Start probiotics daily         I am having Jenna Rhodes maintain her fluticasone, Probiotic Product (PROBIOTIC ADVANCED PO), multivitamin with minerals, DIGESTIVE ENZYMES PO, fexofenadine, and ibuprofen.  No orders of the defined types were placed in this encounter.   CMA served as Education administrator during this visit. History, Physical and Plan performed by medical provider. Documentation and orders reviewed and attested to.  Penni Homans, MD

## 2017-09-09 DIAGNOSIS — K529 Noninfective gastroenteritis and colitis, unspecified: Secondary | ICD-10-CM | POA: Insufficient documentation

## 2017-09-09 NOTE — Assessment & Plan Note (Addendum)
For roughly 48 hours was experiencing abdominal pain and anorexia. Feels somewhat better today and has managed to eat a little bland food today. No fevers or chills noted. Likely has a resolving Norovirus. She will report any concerning symptoms. Maintain clear fluids and bland diet for next 24 hours. Start probiotics daily

## 2017-11-02 ENCOUNTER — Telehealth: Payer: Self-pay | Admitting: Obstetrics & Gynecology

## 2017-11-02 NOTE — Telephone Encounter (Signed)
Spoke with patient and she has multiple questions. She states she has had 2 episodes of RUQ pain and nausea since 08/2017 and wanted to know if she should make appointment with our office or call PCP? Advised pt.to call PCP.   Patient also states in next month she and husband plan to try to conceive and wants to know if she con continue taking her probiotic and digestive enzymes? Advised will discuss with Dr. Sabra Heck and call her back. She has AEX 01-07-18. Routed to Dr. Sabra Heck

## 2017-11-02 NOTE — Telephone Encounter (Signed)
Spoke with patient and notified to stop probiotics and digestive enzymes as soon as she has a positive pregnancy test. Patient voices understanding.

## 2017-11-02 NOTE — Telephone Encounter (Signed)
Patient has some questions for the nurse about some symptoms she's having.

## 2017-11-02 NOTE — Telephone Encounter (Signed)
She should stop those as soon as she has a positive pregnancy test.

## 2017-11-27 ENCOUNTER — Telehealth: Payer: Self-pay | Admitting: Obstetrics & Gynecology

## 2017-11-27 ENCOUNTER — Ambulatory Visit (INDEPENDENT_AMBULATORY_CARE_PROVIDER_SITE_OTHER): Payer: 59 | Admitting: Obstetrics & Gynecology

## 2017-11-27 ENCOUNTER — Encounter: Payer: Self-pay | Admitting: Obstetrics & Gynecology

## 2017-11-27 VITALS — BP 120/64 | HR 80 | Temp 98.1°F | Resp 14 | Ht 73.0 in | Wt 167.0 lb

## 2017-11-27 DIAGNOSIS — R1031 Right lower quadrant pain: Secondary | ICD-10-CM

## 2017-11-27 DIAGNOSIS — R102 Pelvic and perineal pain: Secondary | ICD-10-CM | POA: Diagnosis not present

## 2017-11-27 DIAGNOSIS — D72828 Other elevated white blood cell count: Secondary | ICD-10-CM

## 2017-11-27 LAB — POCT URINE PREGNANCY: Preg Test, Ur: NEGATIVE

## 2017-11-27 NOTE — Telephone Encounter (Signed)
Patient is having abdominal pain and would like an appointment today if possible.

## 2017-11-27 NOTE — Progress Notes (Signed)
GYNECOLOGY  VISIT  CC:   Abdominal pain  HPI: 27 y.o. G0P0000 Married Caucasian female here for pelvic pain.  Pt reports in January she had RLQ pain that was pretty significant.  She went to see Dr. Randel Pigg.  She was diagnosed with norovirus.  Had elevated WBC ct as well as issues with looser stools as well.  This lasted a few weeks but seems to have resolved.    In February, March and April, she experienced RLQ pain that seems to be located in the same place.  She has not had any new issues with bowel movements except being more constipated.     Reports she is trying to eat more cleanly and is surprised that she's having more pain instead of less pain.  There are times when the pain is in different locations of her abdomen   Stopped OCP in January, 2017.  Has been using condoms for contraception.  Stopped using condoms over the past two months.  Wondering if she needs additional evaluation before proceeding with trying for pregnancy.  Is taking a PNV.  UPT= Negative   GYNECOLOGIC HISTORY: Patient's last menstrual period was 11/06/2017. Contraception: none Menopausal hormone therapy: none  Patient Active Problem List   Diagnosis Date Noted  . Gastroenteritis 09/09/2017  . Abdominal pain 09/07/2017  . Palpitations 09/21/2016  . Anxiety state 09/21/2016  . Hyperlipidemia 08/30/2016  . Allergic state 08/17/2016  . Myalgia 08/17/2016  . Gastro-esophageal reflux 08/17/2016  . Encounter for screening and preventative care 08/17/2016    Past Medical History:  Diagnosis Date  . Allergic state 08/17/2016  . Allergy    pt. reported seasonal allergies  . Encounter for screening and preventative care 08/17/2016  . Gastro-esophageal reflux 08/17/2016  . Hyperlipidemia 08/30/2016  . Myalgia 08/17/2016  . Palpitations 09/21/2016    Past Surgical History:  Procedure Laterality Date  . MANDIBLE SURGERY  08/28/2002    MEDS:   Current Outpatient Medications on File Prior to Visit   Medication Sig Dispense Refill  . DIGESTIVE ENZYMES PO Take by mouth.    . fexofenadine (ALLEGRA) 180 MG tablet Take 1 tablet by mouth daily.    . fluticasone (FLONASE) 50 MCG/ACT nasal spray Place into both nostrils as needed for allergies or rhinitis.    Marland Kitchen ibuprofen (ADVIL,MOTRIN) 200 MG tablet Take 200 mg by mouth as needed.    . Multiple Vitamins-Minerals (MULTIVITAMIN WITH MINERALS) tablet Take 1 tablet by mouth daily.     No current facility-administered medications on file prior to visit.     ALLERGIES: Amoxicillin; Clindamycin/lincomycin; and Meloxicam  Family History  Problem Relation Age of Onset  . Parkinson's disease Father   . Diabetes Maternal Grandmother   . Cancer Maternal Grandmother        breast   . Hypertension Maternal Grandmother   . Breast cancer Maternal Grandmother   . Cancer Paternal Grandmother        stomach, lymphoma  . Neuropathy Paternal Grandfather   . Cancer Paternal Grandfather        prostate  . Hearing loss Paternal Grandfather        s/p bypass    SH:  Married, non smoker  Review of Systems  Gastrointestinal: Positive for abdominal pain.  All other systems reviewed and are negative.   PHYSICAL EXAMINATION:    BP 120/64 (BP Location: Right Arm, Patient Position: Sitting, Cuff Size: Normal)   Pulse 80   Temp 98.1 F (36.7 C) (Oral)   Resp  14   Ht _0  (1.854 m)   Wt 167 lb (75.8 kg)   LMP 11/06/2017   BMI 22.03 kg/m     General appearance: alert, cooperative and appears stated age CV:  Regular rate and rhythm Lungs:  clear to auscultation, no wheezes, rales or rhonchi, symmetric air entry Abdomen: soft, mild RLQ tenderness; bowel sounds normal; no masses,  no organomegaly  Pelvic: External genitalia:  no lesions              Urethra:  normal appearing urethra with no masses, tenderness or lesions              Bartholins and Skenes: normal                 Vagina: normal appearing vagina with normal color and discharge, no  lesions              Cervix: no lesions              Bimanual Exam:  Uterus:  normal size, contour, position, consistency, mobility, non-tender              Adnexa: mild right adnexal tenderness  Chaperone was present for exam.  Assessment: RLQ pain/pelvic pain in female.  Differential diagnosis discussed. H/O elevated WBC ct in January  Plan: CBC and CMP obtained today.  If abnormal will need differential and ESR as well as smear.  May need hematology an/or GI consultation as well. PUS ordered.   ~25 minutes spent with patient >50% of time was in face to face discussion of above.

## 2017-11-27 NOTE — Telephone Encounter (Signed)
Spoke with patient. Patient states that she has been having pain in her right lower abdomen/pelvis for 2-3 months intermittently. Feels pain in worsening and may be related to ovulation. Denies any fever, chills, nausea, vomiting. Is scheduled to see GI next week, but wants to rule out GYN cause. Requesting appointment today. Appointment scheduled for today at 3:30 pm with Dr.Miller. Patient is agreeable to date and time.  Routing to provider for final review. Patient agreeable to disposition. Will close encounter.

## 2017-11-27 NOTE — Progress Notes (Signed)
Patient scheduled while in office for PUS on 12/13/17 at 3pm, consult to follow at 3:30pm with Dr. Sabra Heck. Patient verbalizes understanding and is agreeable.

## 2017-11-28 LAB — COMPREHENSIVE METABOLIC PANEL
ALT: 17 IU/L (ref 0–32)
AST: 23 IU/L (ref 0–40)
Albumin/Globulin Ratio: 1.8 (ref 1.2–2.2)
Albumin: 5.1 g/dL (ref 3.5–5.5)
Alkaline Phosphatase: 80 IU/L (ref 39–117)
BUN/Creatinine Ratio: 12 (ref 9–23)
BUN: 12 mg/dL (ref 6–20)
Bilirubin Total: 0.7 mg/dL (ref 0.0–1.2)
CO2: 22 mmol/L (ref 20–29)
Calcium: 10 mg/dL (ref 8.7–10.2)
Chloride: 103 mmol/L (ref 96–106)
Creatinine, Ser: 1 mg/dL (ref 0.57–1.00)
GFR calc Af Amer: 89 mL/min/{1.73_m2} (ref >59)
GFR calc non Af Amer: 77 mL/min/{1.73_m2} (ref >59)
Globulin, Total: 2.8 g/dL (ref 1.5–4.5)
Glucose: 93 mg/dL (ref 65–99)
Potassium: 4 mmol/L (ref 3.5–5.2)
Sodium: 141 mmol/L (ref 134–144)
Total Protein: 7.9 g/dL (ref 6.0–8.5)

## 2017-11-28 LAB — CBC
Hematocrit: 47.1 % — ABNORMAL HIGH (ref 34.0–46.6)
Hemoglobin: 16 g/dL — ABNORMAL HIGH (ref 11.1–15.9)
MCH: 31 pg (ref 26.6–33.0)
MCHC: 34 g/dL (ref 31.5–35.7)
MCV: 91 fL (ref 79–97)
Platelets: 328 10*3/uL (ref 150–379)
RBC: 5.16 x10E6/uL (ref 3.77–5.28)
RDW: 13.2 % (ref 12.3–15.4)
WBC: 12.5 10*3/uL — ABNORMAL HIGH (ref 3.4–10.8)

## 2017-11-30 ENCOUNTER — Encounter: Payer: Self-pay | Admitting: Obstetrics & Gynecology

## 2017-12-07 ENCOUNTER — Telehealth: Payer: Self-pay | Admitting: Obstetrics & Gynecology

## 2017-12-07 NOTE — Telephone Encounter (Signed)
Patient cancelled ultrasound for Thursday 4/18 and will call back to reschedule.

## 2017-12-07 NOTE — Telephone Encounter (Signed)
Left message to call Page Pucciarelli at [phone].  

## 2017-12-13 ENCOUNTER — Other Ambulatory Visit: Payer: Self-pay | Admitting: Obstetrics & Gynecology

## 2017-12-13 ENCOUNTER — Other Ambulatory Visit: Payer: Self-pay

## 2017-12-13 NOTE — Addendum Note (Signed)
Addended by: Megan Salon on: 12/13/2017 03:35 PM   Modules accepted: Orders

## 2017-12-13 NOTE — Telephone Encounter (Signed)
Spoke with patient, advised as seen below per Dr. Sabra Heck. Lab appointment scheduled for 03/07/18 at 3:30pm. Patient declined to reschedule PUS at this time due to out of pocket cost, see account notes. Patient aware to return call to office to schedule PUS. Patient verbalizes understanding.   Routing to provider for final review. Patient is agreeable to disposition. Will close encounter.   Cc: Magdalene Patricia, [address] [employer]

## 2017-12-13 NOTE — Telephone Encounter (Signed)
-----   Message from Megan Salon, MD sent at 12/13/2017  3:34 PM EDT ----- Please call pt.  I was going to review her lab work with her when she came for her ultrasound this week but I now see she cancelled the appt.  Her WBC count was elevated.  I reviewed this with Dr. Marin Olp, hematology, who recommended this be repeated with a differential again in 3 months.  I have placed an order for this.  Can you call and let her know this?  Thanks.

## 2018-01-02 ENCOUNTER — Encounter: Payer: Self-pay | Admitting: Family

## 2018-01-02 ENCOUNTER — Ambulatory Visit (INDEPENDENT_AMBULATORY_CARE_PROVIDER_SITE_OTHER): Payer: 59 | Admitting: Family

## 2018-01-02 VITALS — BP 131/73 | HR 73 | Temp 98.7°F | Resp 16 | Ht 73.0 in | Wt 166.2 lb

## 2018-01-02 DIAGNOSIS — K5900 Constipation, unspecified: Secondary | ICD-10-CM

## 2018-01-02 DIAGNOSIS — R5383 Other fatigue: Secondary | ICD-10-CM | POA: Diagnosis not present

## 2018-01-02 LAB — COMPREHENSIVE METABOLIC PANEL
ALT: 14 U/L (ref 0–35)
AST: 18 U/L (ref 0–37)
Albumin: 4.3 g/dL (ref 3.5–5.2)
Alkaline Phosphatase: 58 U/L (ref 39–117)
BUN: 7 mg/dL (ref 6–23)
CO2: 27 mEq/L (ref 19–32)
Calcium: 9.6 mg/dL (ref 8.4–10.5)
Chloride: 104 mEq/L (ref 96–112)
Creatinine, Ser: 0.93 mg/dL (ref 0.40–1.20)
GFR: 76.19 mL/min (ref >60.00)
Glucose, Bld: 90 mg/dL (ref 70–99)
Potassium: 4.4 mEq/L (ref 3.5–5.1)
Sodium: 139 mEq/L (ref 135–145)
Total Bilirubin: 0.8 mg/dL (ref 0.2–1.2)
Total Protein: 7.2 g/dL (ref 6.0–8.3)

## 2018-01-02 LAB — CBC WITH DIFFERENTIAL/PLATELET
Basophils Absolute: 0 10*3/uL (ref 0.0–0.1)
Basophils Relative: 0.6 % (ref 0.0–3.0)
Eosinophils Absolute: 0.1 10*3/uL (ref 0.0–0.7)
Eosinophils Relative: 1.1 % (ref 0.0–5.0)
HCT: 40.5 % (ref 36.0–46.0)
Hemoglobin: 13.9 g/dL (ref 12.0–15.0)
Lymphocytes Relative: 27.7 % (ref 12.0–46.0)
Lymphs Abs: 2.1 10*3/uL (ref 0.7–4.0)
MCHC: 34.3 g/dL (ref 30.0–36.0)
MCV: 89.6 fl (ref 78.0–100.0)
Monocytes Absolute: 0.4 10*3/uL (ref 0.1–1.0)
Monocytes Relative: 5.1 % (ref 3.0–12.0)
Neutro Abs: 5 10*3/uL (ref 1.4–7.7)
Neutrophils Relative %: 65.5 % (ref 43.0–77.0)
Platelets: 271 10*3/uL (ref 150.0–400.0)
RBC: 4.53 Mil/uL (ref 3.87–5.11)
RDW: 13 % (ref 11.5–15.5)
WBC: 7.6 10*3/uL (ref 4.0–10.5)

## 2018-01-02 LAB — TSH: TSH: 1.47 u[IU]/mL (ref 0.35–4.50)

## 2018-01-02 LAB — POCT URINE PREGNANCY: Preg Test, Ur: NEGATIVE

## 2018-01-02 NOTE — Progress Notes (Signed)
Subjective:    Patient ID: Cathren Laine, female    DOB: Sep 27, 1990, 27 y.o.   MRN: [id number]  HPI   Ms. Swearingin is a 27 yr old female who presents today with c/o abdominal pain.  Reports that she has some some pressure on the right side of her belly button. Reports that she went to her GYN last month.  Has had pain intermittently for several months. Initially thought that it was related to her menstrual cycle, but this time she had pain and is not on her cycle currently.  Just had period last week. Reports GYN recommended pelvic ultrasound but the cost was too great so she did not pursue. Reports BM this AM.  C/o fatigue.  Review of Systems See HPI  Past Medical History:  Diagnosis Date  . Allergic state 08/17/2016  . Allergy    pt. reported seasonal allergies  . Encounter for screening and preventative care 08/17/2016  . Gastro-esophageal reflux 08/17/2016  . Hyperlipidemia 08/30/2016  . Myalgia 08/17/2016  . Palpitations 09/21/2016     Social History   Socioeconomic History  . Marital status: Married    Spouse name: Not on file  . Number of children: Not on file  . Years of education: Not on file  . Highest education level: Not on file  Occupational History  . Not on file  Social Needs  . Financial resource strain: Not on file  . Food insecurity:    Worry: Not on file    Inability: Not on file  . Transportation needs:    Medical: Not on file    Non-medical: Not on file  Tobacco Use  . Smoking status: Never Smoker  . Smokeless tobacco: Never Used  Substance and Sexual Activity  . Alcohol use: Yes    Alcohol/week: 1.2 oz    Types: 2 Glasses of wine per week  . Drug use: No  . Sexual activity: Yes    Partners: Male  Lifestyle  . Physical activity:    Days per week: Not on file    Minutes per session: Not on file  . Stress: Not on file  Relationships  . Social connections:    Talks on phone: Not on file    Gets together: Not on file    Attends  religious service: Not on file    Active member of club or organization: Not on file    Attends meetings of clubs or organizations: Not on file    Relationship status: Not on file  . Intimate partner violence:    Fear of current or ex partner: Not on file    Emotionally abused: Not on file    Physically abused: Not on file    Forced sexual activity: Not on file  Other Topics Concern  . 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 MANDIBLE SURGERY  08/28/2002    Family History  Problem Relation Age of Onset  . Parkinson's disease Father   . Diabetes Maternal Grandmother   . Cancer Maternal Grandmother        breast   . Hypertension Maternal Grandmother   . Breast cancer Maternal Grandmother   . Cancer Paternal Grandmother        stomach, lymphoma  . Neuropathy Paternal Grandfather   . Cancer Paternal Grandfather  prostate  . Hearing loss Paternal Grandfather        s/p bypass    Allergies  Allergen Reactions  . Amoxicillin Rash  . Clindamycin/Lincomycin Rash  . Meloxicam Rash    Current Outpatient Medications on File Prior to Visit  Medication Sig Dispense Refill  . Prenatal Vit-Fe Fumarate-FA (MULTIVITAMIN-PRENATAL) 27-0.8 MG TABS tablet Take 1 tablet by mouth daily at 12 noon.     No current facility-administered medications on file prior to visit.     BP 131/73 (BP Location: Right Arm, Patient Position: Sitting, Cuff Size: Small)   Pulse 73   Temp 98.7 F (37.1 C) (Oral)   Resp 16   Ht 6\' 1"  (1.854 m)   Wt 166 lb 3.2 oz (75.4 kg)   LMP 01/02/2018   SpO2 100%   BMI 21.93 kg/m       Objective:   Physical Exam  Constitutional: She appears well-developed and well-nourished.  Cardiovascular: Normal rate, regular rhythm and normal heart sounds.  No murmur heard. Pulmonary/Chest: Effort normal  and breath sounds normal. No respiratory distress. She has no wheezes.  Abdominal: Bowel sounds are normal. She exhibits no distension.  Very mild right sided abdominal tenderness mid abdomen, no guarding, no mass, no RLQ tenderness. Negative murphy's sign.  Psychiatric: She has a normal mood and affect. Her behavior is normal. Judgment and thought content normal.          Assessment & Plan:  Abdominal pain- pain is mild. Advised pt that the only definite way to know what is going on is to perform a CT scan. Advised her that cost would be at least as much as an ultrasound so she does not wish to complete at this time.  My suspicion for appendicitis is low. I did advise the patient to go to the ER if new/worsening abdominal pain and she verbalizes understanding. I suspect that constipation/mild IBS symptoms are most likely cause. Advised KUB, miralax prn for short term, then switch to metamucil as needed.  (she is considering pregnancy in the near future and miralax is category C). Urine HCG is negative today.  Fatigue- check CBC, CMet, TSH to further evaluate.

## 2018-01-02 NOTE — Patient Instructions (Signed)
Please complete lab work prior to leaving. Complete abdominal x-ray on the first floor. Go to the ER if you develop severe/worsening abdominal pain. Eat a diet high in fiber.  Take a dose of miralax tonight for probably constipation. If you are considering pregnancy, metamucil is a safer option during pregnancy.

## 2018-01-04 NOTE — Progress Notes (Signed)
Patient advised all labs are within normal limits. Advised to proceed with xray, she was asking how much it will cost. I gave her the number to radiology so she can check with them.

## 2018-01-07 ENCOUNTER — Other Ambulatory Visit (HOSPITAL_COMMUNITY)
Admission: RE | Admit: 2018-01-07 | Discharge: 2018-01-07 | Disposition: A | Discharge status: Home / Self Care | Payer: 59 | Source: Ambulatory Visit | Attending: Obstetrics & Gynecology | Admitting: Obstetrics & Gynecology

## 2018-01-07 ENCOUNTER — Other Ambulatory Visit: Payer: Self-pay

## 2018-01-07 ENCOUNTER — Encounter: Payer: Self-pay | Admitting: Obstetrics & Gynecology

## 2018-01-07 ENCOUNTER — Ambulatory Visit (INDEPENDENT_AMBULATORY_CARE_PROVIDER_SITE_OTHER): Payer: 59 | Admitting: Obstetrics & Gynecology

## 2018-01-07 VITALS — BP 104/60 | HR 88 | Resp 16 | Ht 72.75 in | Wt 168.0 lb

## 2018-01-07 DIAGNOSIS — Z01419 Encounter for gynecological examination (general) (routine) without abnormal findings: Secondary | ICD-10-CM | POA: Diagnosis not present

## 2018-01-07 DIAGNOSIS — Z0000 Encounter for general adult medical examination without abnormal findings: Secondary | ICD-10-CM | POA: Diagnosis not present

## 2018-01-07 DIAGNOSIS — R87611 Atypical squamous cells cannot exclude high grade squamous intraepithelial lesion on cytologic smear of cervix (ASC-H): Secondary | ICD-10-CM | POA: Diagnosis not present

## 2018-01-07 LAB — POCT URINALYSIS DIPSTICK
Bilirubin, UA: NEGATIVE
Blood, UA: NEGATIVE
Glucose, UA: NEGATIVE
Ketones, UA: NEGATIVE
Leukocytes, UA: NEGATIVE
Nitrite, UA: NEGATIVE
Protein, UA: NEGATIVE
Urobilinogen, UA: 0.2 E.U./dL
pH, UA: 7 (ref 5.0–8.0)

## 2018-01-07 NOTE — Progress Notes (Signed)
27 y.o. G0P0000 MarriedCaucasianF here for annual exam.  Reports she is still having pelvic pain.  Is more on the left side and a little higher.  Also feels a little closer to the belly button.  Does have digestive "issues" and she has been gluten free and dairy free for several years.  She went off her pro-biotics and digestive enzymes at the beginning of the year.    She was seen by PCP last week.  She did have a repeat WBC count.  This was normal.  CMP and TSH were normal as well.    Cycles are regular since stopping OCPs.  Taking PNV.  Does feel ovulation.  She was seen in early April for pelvic pain.  Feels some of this is still present.  PUS was recommended.  Declines this at this time due to cost.  PCP actually recommended x ray and CT as well which she has declined due to cost as well.  States she can afford to do the tests, just doesn't want to spent her money that way right now.    Patient's last menstrual period was 12/26/2017 (approximate).          Sexually active: Yes.    The current method of family planning is condoms sometimes.    Exercising: Yes.    weights, walking, stretching  Smoker:  no  Health Maintenance: Pap:  12/07/16 ASC-H. Colpo CIN 1 History of abnormal Pap:  yes MMG:  Never TDaP:  2018 Screening Labs: PCP   reports that she has never smoked. She has never used smokeless tobacco. She reports that she drinks about 1.2 oz of alcohol per week. She reports that she does not use drugs.  Past Medical History:  Diagnosis Date  . Allergic state 08/17/2016  . Allergy    pt. reported seasonal allergies  . Encounter for screening and preventative care 08/17/2016  . Gastro-esophageal reflux 08/17/2016  . Hyperlipidemia 08/30/2016  . Myalgia 08/17/2016  . Norovirus   . Palpitations 09/21/2016    Past Surgical History:  Procedure Laterality Date  . MANDIBLE SURGERY  08/28/2002    Current Outpatient Medications  Medication Sig Dispense Refill  . Prenatal Vit-Fe  Fumarate-FA (MULTIVITAMIN-PRENATAL) 27-0.8 MG TABS tablet Take 1 tablet by mouth daily at 12 noon.     No current facility-administered medications for this visit.     Family History  Problem Relation Age of Onset  . Parkinson's disease Father   . Diabetes Maternal Grandmother   . Cancer Maternal Grandmother        breast   . Hypertension Maternal Grandmother   . Breast cancer Maternal Grandmother   . Cancer Paternal Grandmother        stomach, lymphoma  . Neuropathy Paternal Grandfather   . Cancer Paternal Grandfather        prostate  . Hearing loss Paternal Grandfather        s/p bypass    Review of Systems  Gastrointestinal: Positive for abdominal pain.       Change in quality of stool   Musculoskeletal: Positive for myalgias.  All other systems reviewed and are negative.   Exam:   BP 104/60 (BP Location: Right Arm, Patient Position: Sitting, Cuff Size: Normal)   Pulse 88   Resp 16   Ht 6' 0.75" (1.848 m)   Wt 168 lb (76.2 kg)   LMP 12/26/2017 (Approximate)   BMI 22.32 kg/m    Height: 6' 0.75" (184.8 cm)  Ht Readings from  Last 3 Encounters:  01/07/18 6' 0.75" (1.848 m)  01/02/18 6\' 1"  (1.854 m)  11/27/17 6\' 1"  (1.854 m)    General appearance: alert, cooperative and appears stated age Head: Normocephalic, without obvious abnormality, atraumatic Neck: no adenopathy, supple, symmetrical, trachea midline and thyroid normal to inspection and palpation Lungs: clear to auscultation bilaterally Breasts: normal appearance, no masses or tenderness Heart: regular rate and rhythm Abdomen: soft, non-tender; bowel sounds normal; no masses,  no organomegaly Extremities: extremities normal, atraumatic, no cyanosis or edema Skin: Skin color, texture, turgor normal. No rashes or lesions Lymph nodes: Cervical, supraclavicular, and axillary nodes normal. No abnormal inguinal nodes palpated Neurologic: Grossly normal   Pelvic: External genitalia:  no lesions               Urethra:  normal appearing urethra with no masses, tenderness or lesions              Bartholins and Skenes: normal                 Vagina: normal appearing vagina with normal color and discharge, no lesions              Cervix: no lesions              Pap taken: Yes.   Bimanual Exam:  Uterus:  normal size, contour, position, consistency, mobility, non-tender              Adnexa: normal adnexa and no mass, fullness, tenderness               Rectovaginal: Confirms               Anus:  normal sphincter tone, no lesions  Chaperone was present for exam.  A:  Well Woman with normal exam H/O ASCUS H pap.  CIN 1 on colposcopy. RLQ, right mid quadrant pain Gluten free and dairy free Constipation H/O elevated WBC ct, normal with recheck Considering pregnancy  P:   Mammogram not indicated at this time pap smear and HR HPV obtained today Recommended trial of restarting pro-biotics and digestive enzymes to see if this will help her pain before doing any testing as she doesn't really want to proceed with testing right now.  If this resolves her problems, would then recommend continuing them until has +UPT.  Would recommend to stop at that time. On PNV return annually or prn

## 2018-01-09 LAB — CYTOLOGY - PAP
Diagnosis: NEGATIVE
HPV: NOT DETECTED

## 2018-03-04 ENCOUNTER — Ambulatory Visit: Payer: 59 | Admitting: Obstetrics & Gynecology

## 2018-03-04 ENCOUNTER — Telehealth: Payer: Self-pay | Admitting: Obstetrics & Gynecology

## 2018-03-04 NOTE — Telephone Encounter (Signed)
Patient has an appointment for labs 03/07/18 and has questions about these labs?

## 2018-03-04 NOTE — Telephone Encounter (Signed)
Spoke with patient. Patient previously scheduled for repeat CBC on 03/07/18. Elevated WBC count on 11/27/17, was repeated on 5/8 with PCP, WNL. Seen for AEX on 01/07/18.  Lab appointment cancelled for 03/07/18. Advised Dr. Sabra Heck will review, I will return call if any additional recommendations.   Routing to provider for final review. Patient is agreeable to disposition. Will close encounter.

## 2018-03-07 ENCOUNTER — Other Ambulatory Visit: Payer: Self-pay

## 2018-05-30 ENCOUNTER — Telehealth: Payer: Self-pay | Admitting: Obstetrics & Gynecology

## 2018-05-30 NOTE — Telephone Encounter (Signed)
Routing to Dr. Sabra Heck and will close encounter.

## 2018-05-30 NOTE — Telephone Encounter (Signed)
Patient called and said she had a positive urine pregnancy test today, LMP: 05/01/18. She scheduled a consultation with Dr. Sabra Heck for Thursday, 06/06/18, at 8:15 AM. Patient stated she does not have any questions or concerns at this time but will call if she does. Routing to triage for FYI only. No call back needed per patient.

## 2018-06-06 ENCOUNTER — Ambulatory Visit (INDEPENDENT_AMBULATORY_CARE_PROVIDER_SITE_OTHER): Payer: 59 | Admitting: Obstetrics & Gynecology

## 2018-06-06 ENCOUNTER — Encounter: Payer: Self-pay | Admitting: Obstetrics & Gynecology

## 2018-06-06 ENCOUNTER — Telehealth: Payer: Self-pay | Admitting: Obstetrics & Gynecology

## 2018-06-06 ENCOUNTER — Other Ambulatory Visit: Payer: Self-pay

## 2018-06-06 VITALS — BP 130/60 | HR 76 | Resp 16 | Ht 72.75 in | Wt 164.4 lb

## 2018-06-06 DIAGNOSIS — N912 Amenorrhea, unspecified: Secondary | ICD-10-CM

## 2018-06-06 LAB — POCT URINE PREGNANCY: Preg Test, Ur: POSITIVE — AB

## 2018-06-06 NOTE — Progress Notes (Signed)
GYNECOLOGY  VISIT  CC:   Amenorrhea with +UPT  HPI: 27 y.o. G40P0000 Married White or Caucasian female here for to discuss positive home UPT.  Feeling a lot of fatigue--more than she expected.  Not really having nausea yet.  Does have some breast tenderness.  Husband accompanies her today.  They have many questions.  Taking PNV and fish oil.  Having some constipation.  Remedies discussed.    No cats in the home.  Knows not to change kitty litter.  Tdap 4/18.  Had chicken pox.  Aware flu vaccine safe and recommended.  Not sure she wants a flu shot.    Fish/shellfish/mercury discuss.  Patient does eat fish.  Recommended double checking list of high mercury fish and limiting to one time weekly or less.  Unpasteurized cheese/juices discussed.  Nitrites in foods disucssed.  Exercise and intercourse discussed.  Is doing ab work right now but doesn't really feel like it due to fatigue. Wonders when she will need to stop.  Fetal DNA particle testing discussed.  First trimester down's testing discussed.  Cystic fibrosis discussed.    Transfer to ob care discussed.  Will plan viability PUS here first per her request.  LMP 05/01/18.  EGA 5 0/7 weeks.  [facility] 6/10/202.  GYNECOLOGIC HISTORY: Patient's last menstrual period was 05/01/2018.   Patient Active Problem List   Diagnosis Date Noted  . Gastroenteritis 09/09/2017  . Abdominal pain 09/07/2017  . Hyperlipidemia 08/30/2016  . Allergic state 08/17/2016  . Gastro-esophageal reflux 08/17/2016  . Encounter for screening and preventative care 08/17/2016    Past Medical History:  Diagnosis Date  . Allergic state 08/17/2016  . Allergy    pt. reported seasonal allergies  . Encounter for screening and preventative care 08/17/2016  . Gastro-esophageal reflux 08/17/2016  . Hyperlipidemia 08/30/2016  . Myalgia 08/17/2016  . Norovirus   . Palpitations 09/21/2016    Past Surgical History:  Procedure Laterality Date  . MANDIBLE SURGERY  08/28/2002     MEDS:   Current Outpatient Medications on File Prior to Visit  Medication Sig Dispense Refill  . Omega-3 Fatty Acids (FISH OIL) 1000 MG CPDR Take by mouth.    . Prenatal Vit-Fe Fumarate-FA (MULTIVITAMIN-PRENATAL) 27-0.8 MG TABS tablet Take 1 tablet by mouth daily at 12 noon.     No current facility-administered medications on file prior to visit.     ALLERGIES: Amoxicillin; Clindamycin/lincomycin; and Meloxicam  Family History  Problem Relation Age of Onset  . Parkinson's disease Father   . Diabetes Maternal Grandmother   . Cancer Maternal Grandmother        breast   . Hypertension Maternal Grandmother   . Breast cancer Maternal Grandmother   . Cancer Paternal Grandmother        stomach, lymphoma  . Neuropathy Paternal Grandfather   . Cancer Paternal Grandfather        prostate  . Hearing loss Paternal Grandfather        s/p bypass    SH:  Married, non smoker  Review of Systems  Constitutional: Positive for fatigue.  Gastrointestinal: Positive for constipation.  All other systems reviewed and are negative.   PHYSICAL EXAMINATION:    BP 130/60 (BP Location: Right Arm, Patient Position: Sitting, Cuff Size: Normal)   Pulse 76   Resp 16   Ht 6' 0.75" (1.848 m)   Wt 164 lb 6.4 oz (74.6 kg)   LMP 05/01/2018   BMI 21.84 kg/m     General appearance: alert,  cooperative and appears stated age No other physical exam performed today  Chaperone was present for exam.  Assessment: Amenorrhea with + UPT  Plan: Return for viability scan.  Questions answered.  Options for ob care given.  She wants to review prior to transfer of care.   ~25 minutes spent with patient >50% of time was in face to face discussion of above.

## 2018-06-06 NOTE — Telephone Encounter (Signed)
Call placed to convey benefits. 

## 2018-06-06 NOTE — Patient Instructions (Signed)
Fetal Particle DNA testing.  [employer]company.  Cystic fibrosis.  Most common genetic abnormality in caucasians.  Can use

## 2018-06-13 ENCOUNTER — Telehealth: Payer: Self-pay | Admitting: Obstetrics & Gynecology

## 2018-06-13 NOTE — Telephone Encounter (Signed)
Message left to return call to Byanka Landrus at [phone].    

## 2018-06-13 NOTE — Telephone Encounter (Signed)
Patient has questions about her upcoming ultrasound appointment.

## 2018-06-13 NOTE — Telephone Encounter (Signed)
It is ok to transfer care at this point and have the PUS done with her early ob appointments.  Please ask her to let me know when she's decided where she wants to go so we can transfer her records and let her know I'm wishing her a wonderful pregnancy.

## 2018-06-13 NOTE — Telephone Encounter (Signed)
Patient returned call. Patient scheduled for viability u/s on 06-20-18. Patient states when benefits were reviewed with her, her OOP cost would be $461. Patient states asking importance of doing u/s now and could she wait to have u/s done? RN advised patient u/s done to show viability of pregnancy and confirm dates, but would review with Dr. Sabra Heck. Patient verbalized understanding and agreeable. Patient states she has not decided on an OB yet, but is researching. Advised would review with Dr. Sabra Heck and return call. Patient agreeable.  Routing to provider for review.

## 2018-06-13 NOTE — Telephone Encounter (Signed)
Call to patient. Message given to patient as seen below from Dr. Sabra Heck. Patient verbalized understanding. Patient states she would like to keep u/s appointment as scheduled for 06-20-18. Will research Obstetricians in the area and return call once she decides on an OB and decides if she would like to transfer care or have viability ultrasound at [facility].   Routing to provider and will close encounter.

## 2018-06-20 ENCOUNTER — Ambulatory Visit (INDEPENDENT_AMBULATORY_CARE_PROVIDER_SITE_OTHER): Payer: 59 | Admitting: Obstetrics & Gynecology

## 2018-06-20 ENCOUNTER — Ambulatory Visit (INDEPENDENT_AMBULATORY_CARE_PROVIDER_SITE_OTHER): Payer: 59

## 2018-06-20 VITALS — BP 120/80 | HR 80 | Resp 16 | Ht 72.75 in | Wt 164.8 lb

## 2018-06-20 DIAGNOSIS — N912 Amenorrhea, unspecified: Secondary | ICD-10-CM

## 2018-06-20 NOTE — Progress Notes (Signed)
27 y.o. G26P0000 Married White or Caucasian female here for pelvic ultrasound for viability scan.  LMP 05/01/18.  EGA is 7 1/7 with [facility] 02/05/2019.  Having fatigue.  Is not having a lot of nausea but changes in food preferences have occurred.  Denies pelvic pain or vaginal bleeding.  Patient's last menstrual period was 05/01/2018.  Findings:  UTERUS: twin pregnancy noted with two separate gestational sacs and two yolk sacs and two fetal poles.  Baby A CRL is 0.98cm, 7 1/7 by measurements.  FCA at 140BPM.  Baby B CRL is 1.05cm, 7 1/7 weeks.  FCA at 141BPM. ADNEXA: Left ovary: with 1.0cm simple cyst       Right ovary: 2.6 x 1.7 x 2.2cm CUL DE SAC: no free fluid  Discussion:  Pt and her husband both had many questions.  These were all answered.  They do not have cats in the home.    Tdap 12/22/16.  Had chicken pox as a child  Aware flu vaccine safe and recommended.  She is not sure she wants to have a flu shot.  Importance was discussed especially with twin pregnancy.  Fish/shellfish/mercury discuss.  Unpasteurized cheese/juices discussed.  Nitrites in foods disucssed.  Exercise and intercourse discussed.  D/w pt transfer of care.  She is unsure about where she wants to go at this time.   She is taking a PNV.  Did not have rubella testing prior to pregnancy.  Assessment:  Twin pregnancy with two viable fetuses with normal FCA and dating is consistent with LMP.  Plan:  Pt will let me know where she is going to go for ob care for transfer of records.  Also, nausea treatment discussed as she is likely going to have some.  Aware to call before transfer with any questions/concerns.  ~30 minutes spent with patient >50% of time was in face to face discussion of above.

## 2018-06-23 ENCOUNTER — Encounter: Payer: Self-pay | Admitting: Obstetrics & Gynecology

## 2018-06-28 ENCOUNTER — Telehealth: Payer: Self-pay | Admitting: Obstetrics & Gynecology

## 2018-06-28 NOTE — Telephone Encounter (Signed)
Return call to patient. Per ROI, can leave message on voice mail which has name confirmation.  Left message with response from Dr Sabra Heck regarding round ligament pain. Encouraged rest, hydration and tylenol if desires. Call prn.   Encounter closed.

## 2018-06-28 NOTE — Telephone Encounter (Signed)
Spoke with patient.  She states she has been feeling intermittent lower pelvic "cramping." She states it is difficult to describe and can't really pinpoint the pain.  Denies bleeding, denies vaginal discharge. Has not been more physically active than usual. No trauma or falls.   [redacted]w[redacted]d pregnant twin pregnancy.   Discussed with patient if not having any vaginal bleeding, vaginal discharge, or GI symptoms, okay to monitor symptoms.   Discussed round ligament pain as possibility, normal part of pregnancy.    Advised patient to call back or seek immediate medical care at closest ED or M[facility] Admissions Unit at [facility] if develops bleeding, vaginal discharge, unable to tolerate fluids, diarrhea, abdominal pain increases or develops fevers.   Pt accepts  Advice but wants to know what Dr. Sabra Heck advises.  Advised would call back today if any additional instructions.

## 2018-06-28 NOTE — Telephone Encounter (Signed)
Please let her know that round ligament pain is quit common and will be more pronounced with a twin pregnancy because the uterus is growing fasting than with one baby.

## 2018-06-28 NOTE — Telephone Encounter (Signed)
Message left to return call to Rodessa at [phone].    Patient currently pregnant.  LMP 05/01/18  Twin Pregnancy.  Was to call with transfer of OB care.

## 2018-06-28 NOTE — Telephone Encounter (Signed)
Patient returning Tracy's call. °

## 2018-06-28 NOTE — Telephone Encounter (Signed)
Patient left message during lunch asking to speak with a nurse.

## 2018-07-03 DIAGNOSIS — Z23 Encounter for immunization: Secondary | ICD-10-CM | POA: Diagnosis not present

## 2018-07-03 LAB — OB RESULTS CONSOLE HEPATITIS B SURFACE ANTIGEN: Hepatitis B Surface Ag: NEGATIVE

## 2018-07-03 LAB — OB RESULTS CONSOLE GC/CHLAMYDIA
Chlamydia: NEGATIVE
Gonorrhea: NEGATIVE

## 2018-07-03 LAB — OB RESULTS CONSOLE RPR: RPR: NONREACTIVE

## 2018-07-03 LAB — OB RESULTS CONSOLE ABO/RH: RH Type: POSITIVE

## 2018-07-03 LAB — OB RESULTS CONSOLE HIV ANTIBODY (ROUTINE TESTING): HIV: NONREACTIVE

## 2018-07-03 LAB — OB RESULTS CONSOLE RUBELLA ANTIBODY, IGM: Rubella: IMMUNE

## 2018-08-28 NOTE — L&D Delivery Note (Signed)
Delivery Note  Pt pushed for three segments of 79mins with 45min breaks in between.   Jenna Rhodes, Jenna Rhodes [[id number]]  At 1:51 AM a viable female was delivered via Vaginal, Spontaneous (Presentation: LOA;  ).  APGAR: 8, 9; weight pending.   Placenta status: delivered intact schultz, .  Cord:3vc  with the following complications: none.  Anesthesia:  Epidural Episiotomy: Median Lacerations: 2nd degree;Vaginal Suture Repair: 2.0 vicryl Est. Blood Loss (mL): 500    Jenna Rhodes, Jenna Rhodes [[id number]]  At 2:00 AM a viable female was delivered via Vaginal, Spontaneous (Presentation:ROA ;  ).  APGAR: 7, 9; weight 5 lb 0.8 oz (2290 g).   Placenta status:delivered, intact, schultz , .  Cord:3vc  with the following complications: nuchal x 1 .  Anesthesia:  Epidural Episiotomy: Median Lacerations: 2nd degree;Vaginal Suture Repair: 2.0 Est. Blood Loss (mL): 500   Mom to postpartum.   Baby A to NICU.   Baby B to NICU.  Isaiah Serge 12/30/2018, 2:28 AM

## 2018-08-29 DIAGNOSIS — O3680X0 Pregnancy with inconclusive fetal viability, not applicable or unspecified: Secondary | ICD-10-CM | POA: Diagnosis not present

## 2018-08-29 DIAGNOSIS — Z3A17 17 weeks gestation of pregnancy: Secondary | ICD-10-CM | POA: Diagnosis not present

## 2018-09-16 DIAGNOSIS — O30042 Twin pregnancy, dichorionic/diamniotic, second trimester: Secondary | ICD-10-CM | POA: Diagnosis not present

## 2018-10-21 DIAGNOSIS — Z3A24 24 weeks gestation of pregnancy: Secondary | ICD-10-CM | POA: Diagnosis not present

## 2018-10-21 DIAGNOSIS — O30042 Twin pregnancy, dichorionic/diamniotic, second trimester: Secondary | ICD-10-CM | POA: Diagnosis not present

## 2018-10-29 DIAGNOSIS — O26819 Pregnancy related exhaustion and fatigue, unspecified trimester: Secondary | ICD-10-CM | POA: Diagnosis not present

## 2018-11-14 DIAGNOSIS — M9903 Segmental and somatic dysfunction of lumbar region: Secondary | ICD-10-CM | POA: Diagnosis not present

## 2018-11-14 DIAGNOSIS — M9902 Segmental and somatic dysfunction of thoracic region: Secondary | ICD-10-CM | POA: Diagnosis not present

## 2018-11-14 DIAGNOSIS — M545 Low back pain: Secondary | ICD-10-CM | POA: Diagnosis not present

## 2018-11-15 DIAGNOSIS — O30043 Twin pregnancy, dichorionic/diamniotic, third trimester: Secondary | ICD-10-CM | POA: Diagnosis not present

## 2018-11-15 DIAGNOSIS — Z3A28 28 weeks gestation of pregnancy: Secondary | ICD-10-CM | POA: Diagnosis not present

## 2018-11-15 DIAGNOSIS — O3663X0 Maternal care for excessive fetal growth, third trimester, not applicable or unspecified: Secondary | ICD-10-CM | POA: Diagnosis not present

## 2018-12-11 DIAGNOSIS — Z3A32 32 weeks gestation of pregnancy: Secondary | ICD-10-CM | POA: Diagnosis not present

## 2018-12-11 DIAGNOSIS — O30043 Twin pregnancy, dichorionic/diamniotic, third trimester: Secondary | ICD-10-CM | POA: Diagnosis not present

## 2018-12-17 DIAGNOSIS — Z23 Encounter for immunization: Secondary | ICD-10-CM | POA: Diagnosis not present

## 2018-12-23 DIAGNOSIS — O30043 Twin pregnancy, dichorionic/diamniotic, third trimester: Secondary | ICD-10-CM | POA: Diagnosis not present

## 2018-12-23 DIAGNOSIS — Z3A33 33 weeks gestation of pregnancy: Secondary | ICD-10-CM | POA: Diagnosis not present

## 2018-12-29 ENCOUNTER — Inpatient Hospital Stay (HOSPITAL_COMMUNITY): Payer: 59

## 2018-12-29 ENCOUNTER — Inpatient Hospital Stay (HOSPITAL_COMMUNITY): Payer: 59 | Admitting: Anesthesiology

## 2018-12-29 ENCOUNTER — Inpatient Hospital Stay (HOSPITAL_COMMUNITY)
Admission: AD | Admit: 2018-12-29 | Discharge: 2019-01-01 | DRG: 805 | Disposition: A | Discharge status: Home / Self Care | Payer: 59 | Attending: Obstetrics and Gynecology | Admitting: Obstetrics and Gynecology

## 2018-12-29 ENCOUNTER — Encounter (HOSPITAL_COMMUNITY): Payer: Self-pay

## 2018-12-29 ENCOUNTER — Other Ambulatory Visit: Payer: Self-pay

## 2018-12-29 DIAGNOSIS — O30049 Twin pregnancy, dichorionic/diamniotic, unspecified trimester: Secondary | ICD-10-CM | POA: Diagnosis not present

## 2018-12-29 DIAGNOSIS — O30043 Twin pregnancy, dichorionic/diamniotic, third trimester: Secondary | ICD-10-CM | POA: Diagnosis present

## 2018-12-29 DIAGNOSIS — O4290 Premature rupture of membranes, unspecified as to length of time between rupture and onset of labor, unspecified weeks of gestation: Secondary | ICD-10-CM

## 2018-12-29 DIAGNOSIS — Z880 Allergy status to penicillin: Secondary | ICD-10-CM

## 2018-12-29 DIAGNOSIS — O42919 Preterm premature rupture of membranes, unspecified as to length of time between rupture and onset of labor, unspecified trimester: Secondary | ICD-10-CM | POA: Diagnosis not present

## 2018-12-29 DIAGNOSIS — Z3A00 Weeks of gestation of pregnancy not specified: Secondary | ICD-10-CM | POA: Diagnosis not present

## 2018-12-29 DIAGNOSIS — Z3A34 34 weeks gestation of pregnancy: Secondary | ICD-10-CM

## 2018-12-29 DIAGNOSIS — O2290 Venous complication in pregnancy, unspecified, unspecified trimester: Secondary | ICD-10-CM | POA: Diagnosis not present

## 2018-12-29 DIAGNOSIS — O42913 Preterm premature rupture of membranes, unspecified as to length of time between rupture and onset of labor, third trimester: Principal | ICD-10-CM | POA: Diagnosis present

## 2018-12-29 DIAGNOSIS — Z3689 Encounter for other specified antenatal screening: Secondary | ICD-10-CM

## 2018-12-29 LAB — RPR: RPR Ser Ql: NONREACTIVE

## 2018-12-29 LAB — CBC
HCT: 36.1 % (ref 36.0–46.0)
Hemoglobin: 12.2 g/dL (ref 12.0–15.0)
MCH: 31.3 pg (ref 26.0–34.0)
MCHC: 33.8 g/dL (ref 30.0–36.0)
MCV: 92.6 fL (ref 80.0–100.0)
Platelets: 251 10*3/uL (ref 150–400)
RBC: 3.9 MIL/uL (ref 3.87–5.11)
RDW: 13 % (ref 11.5–15.5)
WBC: 20.3 10*3/uL — ABNORMAL HIGH (ref 4.0–10.5)
nRBC: 0 % (ref 0.0–0.2)

## 2018-12-29 LAB — URINALYSIS, ROUTINE W REFLEX MICROSCOPIC
Bilirubin Urine: NEGATIVE
Glucose, UA: NEGATIVE mg/dL
Hgb urine dipstick: NEGATIVE
Ketones, ur: NEGATIVE mg/dL
Leukocytes,Ua: NEGATIVE
Nitrite: NEGATIVE
Protein, ur: NEGATIVE mg/dL
Specific Gravity, Urine: 1.003 — ABNORMAL LOW (ref 1.005–1.030)
pH: 7 (ref 5.0–8.0)

## 2018-12-29 LAB — TYPE AND SCREEN
ABO/RH(D): O POS
Antibody Screen: NEGATIVE

## 2018-12-29 LAB — ABO/RH: ABO/RH(D): O POS

## 2018-12-29 LAB — GROUP B STREP BY PCR: Group B strep by PCR: NEGATIVE

## 2018-12-29 LAB — AMNISURE RUPTURE OF MEMBRANE (ROM) NOT AT ARMC: Amnisure ROM: POSITIVE

## 2018-12-29 LAB — OB RESULTS CONSOLE GBS: GBS: NEGATIVE

## 2018-12-29 MED ORDER — LIDOCAINE-EPINEPHRINE (PF) 2 %-1:200000 IJ SOLN
INTRAMUSCULAR | Status: DC | PRN
  Administered 2018-12-29: 3 mL via EPIDURAL
  Administered 2018-12-29: 2 mL via EPIDURAL

## 2018-12-29 MED ORDER — VANCOMYCIN HCL IN DEXTROSE 1-5 GM/200ML-% IV SOLN
1000.0000 mg | Freq: Two times a day (BID) | INTRAVENOUS | Status: DC
  Administered 2018-12-29: 1000 mg via INTRAVENOUS
  Filled 2018-12-29 (×2): qty 200

## 2018-12-29 MED ORDER — FENTANYL-BUPIVACAINE-NACL 0.5-0.125-0.9 MG/250ML-% EP SOLN
12.0000 mL/h | EPIDURAL | Status: DC | PRN
  Filled 2018-12-29: qty 250

## 2018-12-29 MED ORDER — EPHEDRINE 5 MG/ML INJ
10.0000 mg | INTRAVENOUS | Status: DC | PRN
  Filled 2018-12-29: qty 2

## 2018-12-29 MED ORDER — LACTATED RINGERS IV SOLN
500.0000 mL | INTRAVENOUS | Status: DC | PRN
  Administered 2018-12-29 (×2): 500 mL via INTRAVENOUS

## 2018-12-29 MED ORDER — LACTATED RINGERS IV SOLN
INTRAVENOUS | Status: DC
  Administered 2018-12-29 – 2018-12-30 (×4): via INTRAVENOUS

## 2018-12-29 MED ORDER — SODIUM CHLORIDE (PF) 0.9 % IJ SOLN
INTRAMUSCULAR | Status: DC | PRN
  Administered 2018-12-29: 14 mL/h via EPIDURAL

## 2018-12-29 MED ORDER — ACETAMINOPHEN 325 MG PO TABS: 650.0000 mg | ORAL_TABLET | ORAL | Status: DC | PRN

## 2018-12-29 MED ORDER — BETAMETHASONE SOD PHOS & ACET 6 (3-3) MG/ML IJ SUSP
12.0000 mg | INTRAMUSCULAR | Status: DC
  Administered 2018-12-29: 12 mg via INTRAMUSCULAR
  Filled 2018-12-29 (×2): qty 2

## 2018-12-29 MED ORDER — OXYCODONE-ACETAMINOPHEN 5-325 MG PO TABS: 2.0000 | ORAL_TABLET | ORAL | Status: DC | PRN

## 2018-12-29 MED ORDER — BETAMETHASONE SOD PHOS & ACET 6 (3-3) MG/ML IJ SUSP: 12.0000 mg | INTRAMUSCULAR | Status: DC

## 2018-12-29 MED ORDER — PHENYLEPHRINE 40 MCG/ML (10ML) SYRINGE FOR IV PUSH (FOR BLOOD PRESSURE SUPPORT)
80.0000 ug | PREFILLED_SYRINGE | INTRAVENOUS | Status: DC | PRN
  Filled 2018-12-29 (×2): qty 10

## 2018-12-29 MED ORDER — DIPHENHYDRAMINE HCL 50 MG/ML IJ SOLN: 12.5000 mg | INTRAMUSCULAR | Status: DC | PRN

## 2018-12-29 MED ORDER — SOD CITRATE-CITRIC ACID 500-334 MG/5ML PO SOLN: 30.0000 mL | ORAL | Status: DC | PRN

## 2018-12-29 MED ORDER — LACTATED RINGERS IV SOLN: 500.0000 mL | Freq: Once | INTRAVENOUS | Status: DC

## 2018-12-29 MED ORDER — OXYTOCIN 40 UNITS IN NORMAL SALINE INFUSION - SIMPLE MED
2.5000 [IU]/h | INTRAVENOUS | Status: DC
  Filled 2018-12-29: qty 1000

## 2018-12-29 MED ORDER — OXYCODONE-ACETAMINOPHEN 5-325 MG PO TABS: 1.0000 | ORAL_TABLET | ORAL | Status: DC | PRN

## 2018-12-29 MED ORDER — LIDOCAINE HCL (PF) 1 % IJ SOLN: 30.0000 mL | INTRAMUSCULAR | Status: DC | PRN

## 2018-12-29 MED ORDER — ONDANSETRON HCL 4 MG/2ML IJ SOLN: 4.0000 mg | Freq: Four times a day (QID) | INTRAMUSCULAR | Status: DC | PRN

## 2018-12-29 MED ORDER — PHENYLEPHRINE 40 MCG/ML (10ML) SYRINGE FOR IV PUSH (FOR BLOOD PRESSURE SUPPORT)
80.0000 ug | PREFILLED_SYRINGE | INTRAVENOUS | Status: DC | PRN
  Filled 2018-12-29: qty 10

## 2018-12-29 MED ORDER — OXYTOCIN BOLUS FROM INFUSION
500.0000 mL | Freq: Once | INTRAVENOUS | Status: AC
  Administered 2018-12-30: 500 mL via INTRAVENOUS

## 2018-12-29 NOTE — Anesthesia Preprocedure Evaluation (Signed)
Anesthesia Evaluation  Patient identified by MRN, date of birth, ID band Patient awake    Reviewed: Allergy & Precautions, NPO status , Patient's Chart, lab work & pertinent test results  Airway Mallampati: II  TM Distance: >3 FB Neck ROM: Full    Dental no notable dental hx.    Pulmonary neg pulmonary ROS,    Pulmonary exam normal breath sounds clear to auscultation       Cardiovascular negative cardio ROS Normal cardiovascular exam Rhythm:Regular Rate:Normal     Neuro/Psych negative neurological ROS  negative psych ROS   GI/Hepatic Neg liver ROS, GERD  ,  Endo/Other  negative endocrine ROS  Renal/GU negative Renal ROS  negative genitourinary   Musculoskeletal negative musculoskeletal ROS (+)   Abdominal   Peds negative pediatric ROS (+)  Hematology negative hematology ROS (+)   Anesthesia Other Findings Di-di twins  Reproductive/Obstetrics (+) Pregnancy                             Anesthesia Physical Anesthesia Plan  ASA: III  Anesthesia Plan: Epidural   Post-op Pain Management:    Induction:   PONV Risk Score and Plan: Treatment may vary due to age or medical condition  Airway Management Planned: Natural Airway  Additional Equipment:   Intra-op Plan:   Post-operative Plan:   Informed Consent: I have reviewed the patients History and Physical, chart, labs and discussed the procedure including the risks, benefits and alternatives for the proposed anesthesia with the patient or authorized representative who has indicated his/her understanding and acceptance.       Plan Discussed with: Anesthesiologist  Anesthesia Plan Comments: (Patient identified. Risks, benefits, options discussed with patient including but not limited to bleeding, infection, nerve damage, paralysis, failed block, incomplete pain control, headache, blood pressure changes, nausea, vomiting, reactions  to medication, itching, and post partum back pain. Confirmed with bedside nurse the patient's most recent platelet count. Confirmed with the patient that they are not taking any anticoagulation, have any bleeding history or any family history of bleeding disorders. Patient expressed understanding and wishes to proceed. All questions were answered. )        Anesthesia Quick Evaluation

## 2018-12-29 NOTE — MAU Note (Signed)
Leaking clear fld since 19mn. Occ period like cramps. DiDi twin girls. Both vertex this past Monday.

## 2018-12-29 NOTE — Plan of Care (Signed)

## 2018-12-29 NOTE — MAU Provider Note (Signed)
Chief Complaint:  Rupture of Membranes   First Provider Initiated Contact with Patient 12/29/18 0346      HPI: Jenna Rhodes is a 28 y.o. G1P0000 at [redacted]w[redacted]d with di/di twins pregnancy who presents to maternity admissions reporting leaking of fluid.  She woke up feeling a bubble popping sensation then leaking of fluid soaking her underwear and pants, followed by continuous small gushes of fluid soaking 2 more pads before arriving in MAU. There is mild lower abdomen menstrual-like cramping associated with the leaking. There are no other symptoms. She has not tried any treatments. She reports good fetal movement.  HPI  Past Medical History: Past Medical History:  Diagnosis Date  . Allergic state 08/17/2016  . Allergy    pt. reported seasonal allergies  . Encounter for screening and preventative care 08/17/2016  . Gastro-esophageal reflux 08/17/2016  . Hyperlipidemia 08/30/2016  . Myalgia 08/17/2016  . Norovirus   . Palpitations 09/21/2016   s/p stopping birth control pills    Past obstetric history: OB History  Gravida Para Term Preterm AB Living  1 0 0 0 0 0  SAB TAB Ectopic Multiple Live Births  0 0 0 0 0    # Outcome Date GA Lbr Len/2nd Weight Sex Delivery Anes PTL Lv  1 Current             Past Surgical History: Past Surgical History:  Procedure Laterality Date  . MANDIBLE SURGERY  08/28/2002  . WISDOM TOOTH EXTRACTION      Family History: Family History  Problem Relation Age of Onset  . Parkinson's disease Father   . Diabetes Maternal Grandmother   . Cancer Maternal Grandmother        breast   . Hypertension Maternal Grandmother   . Breast cancer Maternal Grandmother   . Cancer Paternal Grandmother        stomach, lymphoma  . Neuropathy Paternal Grandfather   . Cancer Paternal Grandfather        prostate  . Hearing loss Paternal Grandfather        s/p bypass    Social History: Social History   Tobacco Use  . Smoking status: Never Smoker  .  Smokeless tobacco: Never Used  Substance Use Topics  . Alcohol use: Not Currently    Alcohol/week: 2.0 standard drinks    Types: 2 Glasses of wine per week  . Drug use: No    Allergies:  Allergies  Allergen Reactions  . Amoxicillin Rash  . Clindamycin/Lincomycin Rash  . Meloxicam Rash    Meds:  Medications Prior to Admission  Medication Sig Dispense Refill Last Dose  . Omega-3 Fatty Acids (FISH OIL) 1000 MG CPDR Take by mouth.   Taking  . Prenatal Vit-Fe Fumarate-FA (MULTIVITAMIN-PRENATAL) 27-0.8 MG TABS tablet Take 1 tablet by mouth daily at 12 noon.   Taking    ROS:  Review of Systems  Constitutional: Negative for chills, fatigue and fever.  Eyes: Negative for visual disturbance.  Respiratory: Negative for shortness of breath.   Cardiovascular: Negative for chest pain.  Gastrointestinal: Negative for abdominal pain, nausea and vomiting.  Genitourinary: Positive for pelvic pain and vaginal discharge. Negative for difficulty urinating, dysuria, flank pain, vaginal bleeding and vaginal pain.  Neurological: Negative for dizziness and headaches.  Psychiatric/Behavioral: Negative.      I have reviewed patient's Past Medical Hx, Surgical Hx, Family Hx, Social Hx, medications and allergies.   Physical Exam   Patient Vitals for the past 24 hrs:  BP  Temp Pulse Resp SpO2 Height Weight  12/29/18 0352 - - - - 97 % - -  12/29/18 0330 - - - - 97 % - -  12/29/18 0318 129/81 - (!) 126 - - - -  12/29/18 0243 125/83 - - - - - -  12/29/18 0240 (!) 129/96 98.6 F (37 C) (!) 119 18 98 % - -  12/29/18 0237 - - - - - 6\' 1"  (1.854 m) 100.7 kg   Constitutional: Well-developed, well-nourished female in no acute distress.  Cardiovascular: normal rate Respiratory: normal effort GI: Abd soft, non-tender, gravid appropriate for gestational age.  MS: Extremities nontender, no edema, normal ROM Neurologic: Alert and oriented x 4.  GU: Neg CVAT.  PELVIC EXAM: Cervix pink, visually closed,  without lesion, scant amount shiny discharge/fluid, no pooling with valsalva, vaginal walls and external genitalia normal Cervix visually closed/50% effaced Amnisure collected     FHT:  Baby A: Baseline 145 , moderate variability, accelerations present, no decelerations  Baby B: Baseline 145 , moderate variability, accelerations present, no decelerations Contractions: one on toco in 2+ hours    Labs: Results for orders placed or performed during the hospital encounter of 12/29/18 (from the past 24 hour(s))  Urinalysis, Routine w reflex microscopic     Status: Abnormal   Collection Time: 12/29/18  3:28 AM  Result Value Ref Range   Color, Urine STRAW (A) YELLOW   APPearance CLEAR CLEAR   Specific Gravity, Urine 1.003 (L) 1.005 - 1.030   pH 7.0 5.0 - 8.0   Glucose, UA NEGATIVE NEGATIVE mg/dL   Hgb urine dipstick NEGATIVE NEGATIVE   Bilirubin Urine NEGATIVE NEGATIVE   Ketones, ur NEGATIVE NEGATIVE mg/dL   Protein, ur NEGATIVE NEGATIVE mg/dL   Nitrite NEGATIVE NEGATIVE   Leukocytes,Ua NEGATIVE NEGATIVE  Amnisure rupture of membrane (rom)not at [facility]     Status: None   Collection Time: 12/29/18  3:58 AM  Result Value Ref Range   Amnisure ROM POSITIVE       Imaging:  No results found.  Pt informed that the ultrasound is considered a limited OB ultrasound and is not intended to be a complete ultrasound exam.  Patient also informed that the ultrasound is not being completed with the intent of assessing for fetal or placental anomalies or any pelvic abnormalities.  Explained that the purpose of today's ultrasound is to assess for  presentation.  Patient acknowledges the purpose of the exam and the limitations of the study.    Limited OB US Date:12/29/18 EDD : 02/05/19 based on LMP Viability:  FHT detected CRL measurement c/w previous dates Fetal position: Baby A and Baby B noted to be vertex with cranial features identified in pelvis on today's [place]     MAU Course/MDM: Orders Placed  This Encounter  Procedures  . Culture, beta strep (group b only)  . Group B strep by PCR  . Urinalysis, Routine w reflex microscopic  . Amnisure rupture of membrane (rom)not at [place] ordered this encounter  Medications  . DISCONTD: betamethasone acetate-betamethasone sodium phosphate (CELESTONE) injection 12 mg  . betamethasone acetate-betamethasone sodium phosphate (CELESTONE) injection 12 mg     NST reviewed and reactive x 2 Amnisure positive, with clinical exam this indicates PPROM Cervix visually closed/thick, no evidence of active labor Vertex/vertex positions confirmed by bedside US Consult Dr Terri Piedra with presentation, exam findings and test results.  BMZ x 1 dose now, one in 24 hours if still laboring GBS culture and  PCR testing Vancomycin for GBS unknown, PCN allergic Admit to [facility] Anticipate NSVD x 2    Assessment: 1. Preterm premature rupture of membranes (PPROM) with unknown onset of labor   2. Dichorionic diamniotic twin pregnancy in third trimester     Plan: Admit to [place] Certified Nurse-Midwife 12/29/2018 5:34 AM

## 2018-12-29 NOTE — H&P (Signed)
Jenna Rhodes is a 28 y.o.prime female presenting at 66 4/7wks with di - di spontaneous twins for PPROM. Pt reports a "pop" followed by continued leakage of fluid at midnight. PPROM confirmed by amnisure and [place] in MAU. Pt reports contractions since arrival - was closed in MAU and now cervix is 3.5cm. She is dated per LMP which was confirmed with a 7 week [place]. She has had no comps in pregnancy. Babies have had concordant growth. GBS is unknown. She declined genetic screen  [place] today confirms low fluid; vertex/vertex babies OB History    Gravida  1   Para  0   Term  0   Preterm  0   AB  0   Living  0     SAB  0   TAB  0   Ectopic  0   Multiple  0   Live Births  0          Past Medical History:  Diagnosis Date  . Allergic state 08/17/2016  . Allergy    pt. reported seasonal allergies  . Encounter for screening and preventative care 08/17/2016  . Gastro-esophageal reflux 08/17/2016  . Hyperlipidemia 08/30/2016  . Myalgia 08/17/2016  . Norovirus   . Palpitations 09/21/2016   s/p stopping birth control pills   Past Surgical History:  Procedure Laterality Date  . MANDIBLE SURGERY  08/28/2002  . WISDOM TOOTH EXTRACTION     Family History: family history includes Breast cancer in her maternal grandmother; Cancer in her maternal grandmother, paternal grandfather, and paternal grandmother; Diabetes in her maternal grandmother; Hearing loss in her paternal grandfather; Hypertension in her maternal grandmother; Neuropathy in her paternal grandfather; Parkinson's disease in her father. Social History:  reports that she has never smoked. She has never used smokeless tobacco. She reports previous alcohol use of about 2.0 standard drinks of alcohol per week. She reports that she does not use drugs.     Maternal Diabetes: No Genetic Screening: Declined Maternal Ultrasounds/Referrals: Normal Fetal Ultrasounds or other Referrals:  None Maternal Substance Abuse:   No Significant Maternal Medications:  None Significant Maternal Lab Results:  Lab values include: Group B Strep negative Other Comments:  None  Review of Systems  Constitutional: Negative for chills, fever, malaise/fatigue and weight loss.  Eyes: Negative for blurred vision and double vision.  Respiratory: Negative for shortness of breath.   Cardiovascular: Negative for chest pain, palpitations and leg swelling.  Gastrointestinal: Positive for abdominal pain. Negative for heartburn, nausea and vomiting.  Genitourinary: Negative for dysuria and urgency.  Musculoskeletal: Negative for back pain and myalgias.  Skin: Negative for itching and rash.  Neurological: Negative for dizziness and headaches.  Endo/Heme/Allergies: Does not bruise/bleed easily.  Psychiatric/Behavioral: Negative for depression, hallucinations, substance abuse and suicidal ideas. The patient is not nervous/anxious.    Maternal Medical History:  Reason for admission: Rupture of membranes.  Nausea.  Contractions: Onset was 1-2 hours ago.   Frequency: irregular.   Perceived severity is moderate.    Fetal activity: Perceived fetal activity is normal.   Last perceived fetal movement was within the past hour.    Prenatal complications: no prenatal complications Prenatal Complications - Diabetes: none.    Dilation: 3.5 Effacement (%): 90 Station: -2 Exam by:: dr Terri Piedra Blood pressure 126/83, pulse 96, temperature 98.8 F (37.1 C), temperature source Oral, resp. rate 18, height 6\' 1"  (1.854 m), weight 100.7 kg, last menstrual period 05/01/2018, SpO2 99 %. Maternal Exam:  Uterine Assessment: Contraction strength  is moderate.  Contraction frequency is irregular.   Abdomen: Patient reports generalized tenderness.  Estimated fetal weight is AGA x 2.   Fetal presentation: vertex  Introitus: Normal vulva. Vulva is negative for condylomata and lesion.  Normal vagina.  Vagina is negative for condylomata.  Amniotic  fluid character: clear.  Pelvis: adequate for delivery.   Cervix: Cervix evaluated by sterile speculum exam and digital exam.     Fetal Exam Fetal Monitor Review: Baseline rate: Baby A 130; baby B 140.  Variability: moderate (6-25 bpm).   Pattern: accelerations present and no decelerations.    Fetal State Assessment: Category I - tracings are normal.     Physical Exam  Constitutional: She is oriented to person, place, and time. She appears well-developed and well-nourished.  Neck: Normal range of motion.  Cardiovascular: Normal rate.  Respiratory: Effort normal.  GI: Soft. There is generalized abdominal tenderness.  Genitourinary:    Vulva, vagina and uterus normal.     No vulval condylomata or lesion noted.   Musculoskeletal: Normal range of motion.  Neurological: She is alert and oriented to person, place, and time.  Skin: Skin is warm.  Psychiatric: She has a normal mood and affect. Her behavior is normal. Judgment and thought content normal.    Prenatal labs: ABO, Rh: --/--/O POS, O POS Performed at [facility], [address] [place], [place] [address]  [phone]) Antibody: NEG (05/03 1856) Rubella: Immune (11/06 0000) RPR: Nonreactive (11/06 0000)  HBsAg: Negative (11/06 0000)  HIV: Non-reactive (11/06 0000)  GBS: Negative (05/03 0000)   Assessment/Plan: 28YO G1 with di- di twins at 34 4/7weeks with PPROM and now in active labor  Admitted for expectant mgmt via vaginal route Vancomycin started for GBS - result back and neg ; stop antibiotic  Discussed epidural - answered pt questions; will obtain anesth consult Augment as needed/indicated; pt prefers least medication possible Anticipate svd  Venetia Night Banga 12/29/2018, 9:48 AM

## 2018-12-29 NOTE — Progress Notes (Signed)
Per Dr. Terri Piedra, hold pt in MAU for transabd u/s. Will determine transfer of pt, either to L&D or [facility] after u/s results. Blima Singer RN informed of this and will contact L&D with info since room 217 was previously assigned to pt.   Gilmer Mor RN

## 2018-12-29 NOTE — Progress Notes (Signed)
Patient ID: Jenna Rhodes, female   DOB: 08-13-1991, 28 y.o.   MRN: [id number] Pt complete Cat 1 and 130s for both babies Trial push attempted with no descensus due to pt not appreciating pressure or feeling urge. Will labor down and try pushing in 30-60mins Anticipate svd x 2

## 2018-12-29 NOTE — Anesthesia Procedure Notes (Signed)
Epidural Patient location during procedure: OB Start time: 12/29/2018 11:05 AM End time: 12/29/2018 11:20 AM  Staffing Anesthesiologist: Freddrick March, MD Performed: anesthesiologist   Preanesthetic Checklist Completed: patient identified, pre-op evaluation, timeout performed, IV checked, risks and benefits discussed and monitors and equipment checked  Epidural Patient position: sitting Prep: site prepped and draped and DuraPrep Patient monitoring: continuous pulse ox, blood pressure, heart rate and cardiac monitor Approach: midline Location: L3-L4 Injection technique: LOR air  Needle:  Needle type: Tuohy  Needle gauge: 17 G Needle length: 9 cm Needle insertion depth: 6 cm Catheter type: closed end flexible Catheter size: 19 Gauge Catheter at skin depth: 12 cm Test dose: negative  Assessment Sensory level: T8 Events: blood not aspirated, injection not painful, no injection resistance, negative IV test and no paresthesia  Additional Notes Patient identified. Risks/Benefits/Options discussed with patient including but not limited to bleeding, infection, nerve damage, paralysis, failed block, incomplete pain control, headache, blood pressure changes, nausea, vomiting, reactions to medication both or allergic, itching and postpartum back pain. Confirmed with bedside nurse the patient's most recent platelet count. Confirmed with patient that they are not currently taking any anticoagulation, have any bleeding history or any family history of bleeding disorders. Patient expressed understanding and wished to proceed. All questions were answered. Sterile technique was used throughout the entire procedure. Please see nursing notes for vital signs. Test dose was given through epidural catheter and negative prior to continuing to dose epidural or start infusion. Warning signs of high block given to the patient including shortness of breath, tingling/numbness in hands, complete motor block,  or any concerning symptoms with instructions to call for help. Patient was given instructions on fall risk and not to get out of bed. All questions and concerns addressed with instructions to call with any issues or inadequate analgesia.  Reason for block:procedure for pain

## 2018-12-29 NOTE — Progress Notes (Signed)
Patient ID: Jenna Rhodes, female   DOB: 1991-01-10, 28 y.o.   MRN: [id number] Pt comfortable with no complaints.  VSS EFM - A 130, cat 1           B - 130, cat 1 TOCO - contractions q 3-36mins SVE - 8/100/0  A/P: Progressing well in spontaneous labor after SROM         Recheck in two hours or prn         Anticipate svd x 2; plan delivery in OR

## 2018-12-29 NOTE — Progress Notes (Signed)
Stacy RN CN on BS updated with pt's u/s report. May come to 217

## 2018-12-30 ENCOUNTER — Encounter (HOSPITAL_COMMUNITY): Payer: Self-pay

## 2018-12-30 MED ORDER — DIPHENHYDRAMINE HCL 25 MG PO CAPS: 25.0000 mg | ORAL_CAPSULE | Freq: Four times a day (QID) | ORAL | Status: DC | PRN

## 2018-12-30 MED ORDER — BENZOCAINE-MENTHOL 20-0.5 % EX AERO
1.0000 "application " | INHALATION_SPRAY | CUTANEOUS | Status: DC | PRN
  Administered 2018-12-30 – 2019-01-01 (×2): 1 via TOPICAL
  Filled 2018-12-30 (×2): qty 56

## 2018-12-30 MED ORDER — WITCH HAZEL-GLYCERIN EX PADS: 1.0000 "application " | MEDICATED_PAD | CUTANEOUS | Status: DC | PRN

## 2018-12-30 MED ORDER — ZOLPIDEM TARTRATE 5 MG PO TABS: 5.0000 mg | ORAL_TABLET | Freq: Every evening | ORAL | Status: DC | PRN

## 2018-12-30 MED ORDER — TERBUTALINE SULFATE 1 MG/ML IJ SOLN: 0.2500 mg | Freq: Once | INTRAMUSCULAR | Status: DC | PRN

## 2018-12-30 MED ORDER — ACETAMINOPHEN 325 MG PO TABS: 650.0000 mg | ORAL_TABLET | ORAL | Status: DC | PRN

## 2018-12-30 MED ORDER — ONDANSETRON HCL 4 MG PO TABS: 4.0000 mg | ORAL_TABLET | ORAL | Status: DC | PRN

## 2018-12-30 MED ORDER — OXYCODONE HCL 5 MG PO TABS: 5.0000 mg | ORAL_TABLET | ORAL | Status: DC | PRN

## 2018-12-30 MED ORDER — OXYCODONE HCL 5 MG PO TABS: 10.0000 mg | ORAL_TABLET | ORAL | Status: DC | PRN

## 2018-12-30 MED ORDER — SIMETHICONE 80 MG PO CHEW: 80.0000 mg | CHEWABLE_TABLET | ORAL | Status: DC | PRN

## 2018-12-30 MED ORDER — COCONUT OIL OIL
1.0000 "application " | TOPICAL_OIL | Status: DC | PRN
  Administered 2018-12-30: 1 via TOPICAL

## 2018-12-30 MED ORDER — PRENATAL MULTIVITAMIN CH
1.0000 | ORAL_TABLET | Freq: Every day | ORAL | Status: DC
  Administered 2018-12-30 – 2019-01-01 (×3): 1 via ORAL
  Filled 2018-12-30 (×3): qty 1

## 2018-12-30 MED ORDER — TETANUS-DIPHTH-ACELL PERTUSSIS 5-2.5-18.5 LF-MCG/0.5 IM SUSP: 0.5000 mL | Freq: Once | INTRAMUSCULAR | Status: DC

## 2018-12-30 MED ORDER — ONDANSETRON HCL 4 MG/2ML IJ SOLN: 4.0000 mg | INTRAMUSCULAR | Status: DC | PRN

## 2018-12-30 MED ORDER — OXYTOCIN 40 UNITS IN NORMAL SALINE INFUSION - SIMPLE MED
1.0000 m[IU]/min | INTRAVENOUS | Status: DC
  Administered 2018-12-30: 2 m[IU]/min via INTRAVENOUS

## 2018-12-30 MED ORDER — IBUPROFEN 600 MG PO TABS
600.0000 mg | ORAL_TABLET | Freq: Four times a day (QID) | ORAL | Status: DC
  Administered 2018-12-30 – 2019-01-01 (×10): 600 mg via ORAL
  Filled 2018-12-30 (×10): qty 1

## 2018-12-30 MED ORDER — DIBUCAINE (PERIANAL) 1 % EX OINT: 1.0000 "application " | TOPICAL_OINTMENT | CUTANEOUS | Status: DC | PRN

## 2018-12-30 MED ORDER — SENNOSIDES-DOCUSATE SODIUM 8.6-50 MG PO TABS
2.0000 | ORAL_TABLET | ORAL | Status: DC
  Administered 2018-12-30 – 2019-01-01 (×2): 2 via ORAL
  Filled 2018-12-30 (×2): qty 2

## 2018-12-30 NOTE — Anesthesia Postprocedure Evaluation (Signed)
Anesthesia Post Note  Patient: Jenna Rhodes  Procedure(s) Performed: AN AD HOC LABOR EPIDURAL     Patient location during evaluation: Mother Baby Anesthesia Type: Epidural Level of consciousness: awake and alert and oriented Pain management: satisfactory to patient Vital Signs Assessment: post-procedure vital signs reviewed and stable Respiratory status: respiratory function stable Cardiovascular status: stable Postop Assessment: no headache, no backache, epidural receding, patient able to bend at knees, no signs of nausea or vomiting and adequate PO intake Anesthetic complications: no    Last Vitals:  Vitals:   12/30/18 0800 12/30/18 1133  BP: 111/61 115/82  Pulse: 80 88  Resp: 16 16  Temp: 36.5 C 36.6 C  SpO2: 98% 99%    Last Pain:  Vitals:   12/30/18 1348  TempSrc:   PainSc: 3    Pain Goal: Patients Stated Pain Goal: 2 (12/30/18 0900)                 Katherina Mires

## 2018-12-30 NOTE — Progress Notes (Signed)
SVD of viable female infant "Baby B" placed to maternal abdomen via Dr. Terri Piedra.

## 2018-12-30 NOTE — Lactation Note (Signed)
This note was copied from a baby's chart. Lactation Consultation Note  Patient Name: Jenna Rhodes [id number] Date: 12/30/2018 Reason for consult: Initial assessment;Multiple gestation;1st time breastfeeding;Primapara;Late-preterm 34-36.6wks;NICU baby;Infant < 6lbs  31 hours old LPI NICU twins < 6 lbs who are being fed 100% donor milk at this point, mom is already pumping; she's a P1. Mom signed up for online BF classes due to the virus but babies came to early and she was unable to complete them. She's already familiar with hand expression and able to get drops of colostrum when doing so. She has a Spectra 2 DEBP at home.  Mom voiced that the flanges # 27 are working out the best with her DEBP kit. LC had to come to the NICU to complete her assessment because mom wasn't in her room. She told LC that she pumped three time today and "nothing" came out. Explained to mom that the purpose of pumping early on at this stage is mainly for breast stimulation and not to get volume; praised her for efforts.   Baby A  Mom asked for latch assistance only with baby "B" baby "A" has already eaten and she was asleep in her bassinet.  Baby " B"  LC took baby STS to mom's breast in cross cradle position first but baby kept crying and unable to latch. Then mom requested trying a different position, this time it was the football, same thing, baby unable to latch, she just kept crying with his mouth open holding the breast no sucking reflex elicit. Mom kept her STS after this feeding attempts, LC tried to get baby to suck on a gloved finger but sucking was very uncoordinated probably due to prematurity and/or newborn behavior. Asked mom to call for assistance the next time once babies have already turned 24 hours of age, NS may be required to assist with these feedings once babies are ready developmentally. Reviewed normal newborn behavior, pumping schedule, milk storage guidelines and pumping log.  Feeding  plan  1. Encouraged mom to pump every 2-3 hours during the day and at least once at night 2. Mom and dad will both do as much STS as possible 3. Mom will call for assistance when she's ready to take babies back to the breast again 4. NICU feeding plan will continue as stated, both babies currently on NG tube and on donor milk  BF brochure, BF resources and NICU booklet were reviewed. Mom reported all questions and concerns were answered, she's aware of [facility] services and will call PRN.   Maternal Data Formula Feeding for Exclusion: No Has patient been taught Hand Expression?: Yes Does the patient have breastfeeding experience prior to this delivery?: No  Feeding Feeding Type: Breast Fed   Interventions Interventions: Breast feeding basics reviewed;DEBP;Hand express;Breast compression;Breast massage  Lactation Tools Discussed/Used Tools: Pump Breast pump type: Double-Electric Breast Pump WIC Program: No Pump Review: Setup, frequency, and cleaning;Milk Storage Initiated by:: MPeck Date initiated:: 12/30/18   Consult Status Consult Status: Follow-up Date: 12/31/18 Follow-up type: In-patient    [place] 12/30/2018, 7:09 PM

## 2018-12-30 NOTE — Progress Notes (Signed)
Up to bathroom using stedy to attempt to void. Upon entering bathroom pt reports seeing floaters and feeling increasingly "weak". Took patient back to bed, VSS. Bladder scanned resulting in approximately 641mL, I&O catheter performed with 1,037mL of cloudy yellow urine returned. Will transport to [facility] after visiting infants in NICU.

## 2018-12-30 NOTE — Progress Notes (Signed)
SVD of viable female "Baby A" placed to maternal abdomen via Dr. Terri Piedra.

## 2018-12-30 NOTE — Addendum Note (Signed)
Addendum  created 12/30/18 1455 by Flossie Dibble, CRNA   Charge Capture section accepted, Clinical Note Signed

## 2018-12-30 NOTE — Progress Notes (Signed)
PPD #0 No problems, babies stable in NCIU Afeb, VSS Fundus firm, NT at U-0 Continue routine postpartum care

## 2018-12-30 NOTE — Anesthesia Postprocedure Evaluation (Signed)
Anesthesia Post Note  Patient: Jenna Rhodes  Procedure(s) Performed: AN AD HOC LABOR EPIDURAL     Patient location during evaluation: Mother Baby Anesthesia Type: Epidural Level of consciousness: awake and alert Pain management: pain level controlled Vital Signs Assessment: post-procedure vital signs reviewed and stable Respiratory status: spontaneous breathing, nonlabored ventilation and respiratory function stable Cardiovascular status: stable Postop Assessment: no headache, no backache and epidural receding Anesthetic complications: no    Last Vitals:  Vitals:   12/30/18 0403 12/30/18 0416  BP: 120/73 110/68  Pulse: 98 96  Resp: 18 18  Temp:    SpO2:      Last Pain:  Vitals:   12/30/18 0416  TempSrc:   PainSc: 3    Pain Goal:                   Talma Aguillard

## 2018-12-31 LAB — CBC
HCT: 30.8 % — ABNORMAL LOW (ref 36.0–46.0)
Hemoglobin: 10.4 g/dL — ABNORMAL LOW (ref 12.0–15.0)
MCH: 31.9 pg (ref 26.0–34.0)
MCHC: 33.8 g/dL (ref 30.0–36.0)
MCV: 94.5 fL (ref 80.0–100.0)
Platelets: 216 10*3/uL (ref 150–400)
RBC: 3.26 MIL/uL — ABNORMAL LOW (ref 3.87–5.11)
RDW: 13.2 % (ref 11.5–15.5)
WBC: 17.2 10*3/uL — ABNORMAL HIGH (ref 4.0–10.5)
nRBC: 0 % (ref 0.0–0.2)

## 2018-12-31 LAB — CULTURE, BETA STREP (GROUP B ONLY)

## 2018-12-31 NOTE — Progress Notes (Signed)
Post Partum Day 1 (twins) Subjective: no complaints, up ad lib, voiding, tolerating PO and nl lochia, pain controlled  Objective: Blood pressure 105/72, pulse 73, temperature 98 F (36.7 C), temperature source Axillary, resp. rate 18, height 6\' 1"  (1.854 m), weight 100.7 kg, last menstrual period 05/01/2018, SpO2 100 %, unknown if currently breastfeeding.  Physical Exam:  General: alert and no distress Lochia: appropriate Uterine Fundus: firm   Recent Labs    12/29/18 0624 12/31/18 0605  HGB 12.2 10.4*  HCT 36.1 30.8*    Assessment/Plan: Plan for discharge tomorrow, Breastfeeding and Lactation consult.  Routine PP care.   Babies - NICU - RA, feedings, no abx.- doing well  LOS: 2 days   Termaine Roupp [place] 12/31/2018, 7:43 AM

## 2018-12-31 NOTE — Lactation Note (Signed)
This note was copied from a baby's chart. Lactation Consultation Note  Patient Name: Jenna Rhodes [id number] Date: 12/31/2018 Reason for consult: Follow-up assessment   Twins in NICU 26 hours old. < 6 lbs.  [redacted]w[redacted]d. Mother requesting assistance w/ latching. Both twins Servando Snare and Ameila nuzzled, licked and briefly suckled at the breast for approx 10 min.  Mother was able to hand express good flow of colostrum before latching. Encouraged mother to continue STS and nuzzling when able. Mother is pumping 2-3 hours with longer break of 4-5 hours at night. Encouraged a minimum of 8 pumping sessions per day with hand expression before and after pumping.  Reviewed milk storage. Mother has personal DEBP at home.       Maternal Data Has patient been taught Hand Expression?: Yes  Feeding    LATCH Score                   Interventions Interventions: Breast feeding basics reviewed;Assisted with latch;Hand express;DEBP  Lactation Tools Discussed/Used     Consult Status Consult Status: Follow-up Date: 01/01/19 Follow-up type: In-patient    Vivianne Master [facility] 12/31/2018, 9:47 AM

## 2019-01-01 MED ORDER — IBUPROFEN 600 MG PO TABS: 600.0000 mg | ORAL_TABLET | Freq: Four times a day (QID) | ORAL | 0 refills | Status: DC

## 2019-01-01 MED ORDER — ACETAMINOPHEN 325 MG PO TABS: 650.0000 mg | ORAL_TABLET | ORAL | 1 refills | Status: DC | PRN

## 2019-01-01 NOTE — Progress Notes (Signed)
Patient screened out for psychosocial assessment since none of the following apply:  Psychosocial stressors documented in mother or baby's chart  Gestation less than 32 weeks  Code at delivery   Infant with anomalies Please contact the Clinical Social Worker if specific needs arise, by MOB's request, or if MOB scores greater than 9/yes to question 10 on Edinburgh Postpartum Depression Screen.  Imir Brumbach, LCSW Clinical Social Worker [facility] Cell#: [phone]     

## 2019-01-01 NOTE — Progress Notes (Signed)
Pt had TDAPT  In office

## 2019-01-01 NOTE — Progress Notes (Signed)
Post Partum Day 2  Subjective:  Pt in NICU working on pumping/feeds no complaints, up ad lib and tolerating PO.  States bleeding normal and cramping with pumping only.  Objective: Blood pressure 121/73, pulse 67, temperature 98.1 F (36.7 C), temperature source Oral, resp. rate 18, height 6\' 1"  (1.854 m), weight 100.7 kg, last menstrual period 05/01/2018, SpO2 100 %, unknown if currently breastfeeding.  Physical Exam:  General: alert and cooperative Lochia: appropriate Uterine Fundus: firm   Recent Labs    12/31/18 0605  HGB 10.4*  HCT 30.8*    Assessment/Plan: Discharge home  Babies stable in NICU, working on feeds D/w pt getting scripts if she is going to stay with baby in NICU   LOS: 3 days   Logan Bores 01/01/2019, 9:49 AM

## 2019-01-01 NOTE — Discharge Summary (Signed)
OB Discharge Summary     Patient Name: Jenna Rhodes DOB: [birth year] MRN: [id number]  Date of admission: 12/29/2018 Delivering MD:    Jenna, Rhodes [[id number]]  Rhodes, Jenna [facility]   Jenna, Rhodes [[id number]]  Jenna Rhodes [facility]   Date of discharge: 01/01/2019  Admitting diagnosis: 34wks, possible water broke Intrauterine pregnancy: [redacted]w[redacted]d     Secondary diagnosis:  Active Problems:   Preterm premature rupture of membranes (PPROM) with unknown onset of labor   SVD (spontaneous vaginal delivery)   Postpartum care following vaginal delivery  Additional problems: none     Discharge diagnosis: Preterm Pregnancy Delivered                                                                                                Post partum procedures:none   Complications: None  Hospital course:  Onset of Labor With Vaginal Delivery     28 y.o. yo G1P0102 at [redacted]w[redacted]d was admitted in Latent Labor on 12/29/2018. Patient had an uncomplicated labor course as follows:  Membrane Rupture Time/Date:    Jenna, Rhodes [[id number]]  12:00 AM   Jenna, Rhodes [[id number]]  1:56 AM ,   Jenna, Rhodes [[id number]]  12/29/2018   Jenna, Rhodes [[id number]]  12/29/2018   Intrapartum Procedures: Episiotomy:    Jenna Rhodes [[id number]]  Median [2]   Jenna, Rhodes [[id number]]  Median [2]                                         Lacerations:     Jenna, Rhodes [[id number]]  2nd degree [3];Vaginal [6]   Jenna, Rhodes [[id number]]  2nd degree [3];Vaginal [6]  Patient had a delivery of a Viable infant.   Jenna, Rhodes [[id number]]  12/30/2018   Jenna, Rhodes [[id number]]  12/30/2018 Information for the patient's newborn:  Jenna, Rhodes [[id number]]    Information for the patient's newborn:  Jenna, Rhodes [[id number]]  Delivery Method: Vaginal, Spontaneous(Filed from Delivery  Summary)    Pateint had an uncomplicated postpartum course.  She is ambulating, tolerating a regular diet, passing flatus, and urinating well. Patient is discharged home in stable condition on 01/01/19.   Physical exam  Vitals:   12/31/18 2005 01/01/19 0102 01/01/19 0419 01/01/19 0800  BP: 117/80 118/74 (!) 93/54 121/73  Pulse: 85 75 66 67  Resp: 18 16 18 18   Temp: 97.7 F (36.5 C)  98.3 F (36.8 C) 98.1 F (36.7 C)  TempSrc: Axillary  Oral Oral  SpO2: 100% 100% 99% 100%  Weight:      Height:       General: alert and cooperative Lochia: appropriate Uterine Fundus: firm  Labs: Lab Results  Component Value Date   WBC 17.2 (H) 12/31/2018   HGB 10.4 (L) 12/31/2018   HCT 30.8 (L) 12/31/2018   MCV 94.5 12/31/2018   PLT 216 12/31/2018   CMP Latest Ref Rng & Units 01/02/2018  Glucose 70 - 99 mg/dL  90  BUN 6 - 23 mg/dL 7  Creatinine 0.40 - 1.20 mg/dL 0.93  Sodium 135 - 145 mEq/L 139  Potassium 3.5 - 5.1 mEq/L 4.4  Chloride 96 - 112 mEq/L 104  CO2 19 - 32 mEq/L 27  Calcium 8.4 - 10.5 mg/dL 9.6  Total Protein 6.0 - 8.3 g/dL 7.2  Total Bilirubin 0.2 - 1.2 mg/dL 0.8  Alkaline Phos 39 - 117 U/L 58  AST 0 - 37 U/L 18  ALT 0 - 35 U/L 14    Discharge instruction: per After Visit Summary and "Baby and Me Booklet".  After visit meds:  Allergies as of 01/01/2019      Reactions   Amoxicillin Rash   Did it involve swelling of the face/tongue/throat, SOB, or low BP? No Did it involve sudden or severe rash/hives, skin peeling, or any reaction on the inside of your mouth or nose? Yes Did you need to seek medical attention at a hospital or doctor's office? No When did it last happen?within last 10 years If all above answers are "NO", may proceed with cephalosporin use.   Clindamycin/lincomycin Rash   Meloxicam Rash      Medication List    TAKE these medications   acetaminophen 325 MG tablet Commonly known as:  Tylenol Take 2 tablets (650 mg total) by mouth every 4 (four)  hours as needed (for pain scale < 4).   ibuprofen 600 MG tablet Commonly known as:  ADVIL Take 1 tablet (600 mg total) by mouth every 6 (six) hours.   multivitamin-prenatal 27-0.8 MG Tabs tablet Take 1 tablet by mouth daily at 12 noon.       Diet: routine diet  Activity: Advance as tolerated. Pelvic rest for 6 weeks.   Outpatient follow up:6 weeks Follow up Appt:No future appointments. Follow up Visit:No follow-ups on file.  Postpartum contraception: Undecided  Newborn Data:   Natia, Rhodes [[id number]]  Live born female  Birth Weight: 5 lb 1.5 oz (2310 g) APGAR: 51, 9  Newborn Delivery   Birth date/time:  12/30/2018 01:51:00 Delivery type:  Vaginal, Spontaneous      Jenna, Rhodes [[id number]]  Live born female  Birth Weight: 5 lb 0.8 oz (2290 g) APGAR: 7, 9  Newborn Delivery   Birth date/time:  12/30/2018 02:00:00 Delivery type:  Vaginal, Spontaneous     Baby Feeding: Breast Disposition:NICU   01/01/2019 Logan Bores, MD

## 2019-01-01 NOTE — Lactation Note (Signed)
This note was copied from a baby's chart. Lactation Consultation Note  Patient Name: Jenna Rhodes Date: 01/01/2019  Mom will be discharged today and has many questions.  Breasts very full but not engorged.  Mom is pumping every 3 hours.  We discussed preterm feeding norms.  Encouraged to attempt putting babies to breast a few times per day.  Recommended having nurse call us for assist with babies.  Mom has a Spectra pump at home.  Questions answered.   Maternal Data    Feeding Feeding Type: Donor Breast Milk  LATCH Score Latch: Too sleepy or reluctant, no latch achieved, no sucking elicited.  Audible Swallowing: None  Type of Nipple: Everted at rest and after stimulation  Comfort (Breast/Nipple): Soft / non-tender  Hold (Positioning): Assistance needed to correctly position infant at breast and maintain latch.  LATCH Score: 5  Interventions Interventions: Assisted with latch;Support pillows  Lactation Tools Discussed/Used     Consult Status      Ave Filter 01/01/2019, 3:34 PM

## 2019-01-02 ENCOUNTER — Ambulatory Visit: Payer: Self-pay

## 2019-01-02 NOTE — Lactation Note (Addendum)
This note was copied from a baby's chart. Lactation Consultation Note  Patient Name: Jenna Rhodes [id number]'[id number] Date: 01/02/2019 Reason for consult: Follow-up assessment;NICU baby   Twins NICU 39 hours old.   Mother's breasts are full.  She is pumping q 2-4 hours. Mother attempting to latch upon entering. Attempted latching with #20 & #24NS. Mother easily hand expressed volume to prefill nipple shield. Mother has been hand expressing more than with pumping but is approx yielding 50 ml. Baby mouthed nipple but did not suck. Mother concerned that babies are being fed with a bottle. Explained the importance of volume for energy and how it can help w/ sucking skills. Discussed paced feeding. Reviewed cleaning. Mother has spectra pump at home but states she has only been pumping one side at a time but states she has additional parts. Suggest reading directions to learn how to double pump for efficiency.  Encouraged mother that it is a process and praised her for her efforts.     Maternal Data    Feeding    LATCH Score                   Interventions Interventions: Assisted with latch;Hand express;DEBP;Support pillows  Lactation Tools Discussed/Used Tools: Pump;Nipple Shields Nipple shield size: 20;24   Consult Status      Vivianne Master [facility] 01/02/2019, 10:35 AM

## 2019-01-05 ENCOUNTER — Ambulatory Visit: Payer: Self-pay

## 2019-01-05 NOTE — Lactation Note (Signed)
This note was copied from a baby's chart. Lactation Consultation Note  Patient Name: Shantelle Alles [id number] Date: 01/05/2019   NICU RN Claiborne Billings called because mom had a question about a supplement, Dr. Merrilee Jansky electrolite powder. She wanted to know if it was safe for BF. LC Mili and Joycelyn Schmid advised that supplement is probably harmless but that mom can always check with her physician. Per RN, mom doesn't seem dehydrated but she was concerned about her milk supply is currently at 470 ml per 24 hours, she has 57 day old twins. LCs told RN that at this point is desirable to have pumped milk above 500 ml, ideally closer to the 750 ml for twins.   RN said that mom hasn't been sleeping well, advised her to try to get her sleep, it's safe for mom to go 4 hours without pumping at night instead of pumping every 3 hours; at least for the first two weeks, the most critical ones. Mom also aware she needs to take her daily prenatal vitamins. Babies are on donor/mother's milk and fed through a gavage tube. Asked RN Claiborne Billings to call back for a [facility] consult if any other questions or concerns arise.  Maternal Data    Feeding Feeding Type: Breast Milk  LATCH Score                   Interventions    Lactation Tools Discussed/Used     Consult Status      Zailynn Brandel S Eula Jaster 01/05/2019, 5:12 PM

## 2019-01-13 ENCOUNTER — Ambulatory Visit: Payer: 59 | Admitting: Obstetrics & Gynecology

## 2019-01-14 ENCOUNTER — Ambulatory Visit: Payer: Self-pay

## 2019-01-14 NOTE — Lactation Note (Signed)
This note was copied from a baby's chart. Lactation Consultation Note  Patient Name: Jenna Rhodes [id number] Date: 01/14/2019   Mom called for BF help because she had questions, but when [facility] arrived her RN informed she had already left for the day. Called mom to update on her status and she told LC that she'd like to see lactation tomorrow because she has noticed a "lump" on her left breast, she doesn't have a fever and there are not red streaks in the area. She's currently pumping about 90-100 ml combined per pumping session and voiced that her babies are now able to latch without using the NS because her nipple/areola complex are not longer "hard". Praised her for her efforts.  Mom wants to be seem tomorrow between noon-3 pm to take a look at her left breast. She says it's getting better with breast massage but she still wants to see lactation just to make sure. She' now going 4-5 hours without pumping at night and she's getting more sleep, she feels a lot better than she did in week one. LC Mili passed report to [place] to do mom's F/U in the NICU on 01/15/2019 at noon.  Maternal Data    Feeding Feeding Type: Breast Milk   Interventions    Lactation Tools Discussed/Used     Consult Status      Jenna Rhodes 01/14/2019, 9:27 PM

## 2019-01-15 ENCOUNTER — Ambulatory Visit: Payer: Self-pay

## 2019-01-15 NOTE — Lactation Note (Signed)
This note was copied from a baby's chart. Lactation Consultation Note  Patient Name: Berit Raczkowski [id number]'[id number] Date: 01/15/2019 Reason for consult: Follow-up assessment;Other (Comment)(Mother had a question about a lump in her left breast)  Mother requested a lactation consult due to a "lump" in her left breast.  When I arrived to visit with mother she was breast feeding.  She stated that the "lump" felt a little smaller than yesterday.  With permission, I massaged and felt the "lump."  It was a pea sized fibrous type lump that was not painful to the touch.  Mother has had no fever and there are no red marks or streaking to the breast.  I suggested putting a warm compress to the hard area and massaging it prior to latching baby.  Also emphasized continuing to pump and massage during pumping.  When baby is able to try different positions at the breast I encouraged mother to change positions to allow baby to latch onto different areas of the breast.  Mother receptive to all ideas.  However, I did inform her to be very diligent about watching the lump and, if it increases in size or pain, to be sure to call her OB.  Mother will assess regularly.  RN in room and aware of conversation.   Maternal Data    Feeding Feeding Type: Breast Milk Nipple Type: Dr. Lavena Bullion  [facility] Score                   Interventions    Lactation Tools Discussed/Used     Consult Status Consult Status: PRN Date: 01/15/19 Follow-up type: Call as needed    Alayiah Fontes R Graceanna Theissen 01/15/2019, 12:41 PM

## 2019-01-22 ENCOUNTER — Inpatient Hospital Stay (HOSPITAL_COMMUNITY): Admission: RE | Admit: 2019-01-22 | Payer: 59 | Source: Home / Self Care

## 2019-04-03 ENCOUNTER — Ambulatory Visit: Payer: 59 | Admitting: Obstetrics & Gynecology

## 2019-04-29 ENCOUNTER — Ambulatory Visit: Payer: 59 | Admitting: Gastroenterology

## 2019-10-23 ENCOUNTER — Ambulatory Visit: Payer: 59 | Admitting: Gastroenterology

## 2019-11-10 ENCOUNTER — Ambulatory Visit: Payer: 59 | Admitting: Gastroenterology

## 2019-11-10 ENCOUNTER — Encounter: Payer: Self-pay | Admitting: Gastroenterology

## 2019-11-10 VITALS — BP 110/80 | HR 84 | Temp 98.6°F | Ht 72.0 in | Wt 179.5 lb

## 2019-11-10 DIAGNOSIS — R140 Abdominal distension (gaseous): Secondary | ICD-10-CM | POA: Diagnosis not present

## 2019-11-10 DIAGNOSIS — R1084 Generalized abdominal pain: Secondary | ICD-10-CM

## 2019-11-10 NOTE — Patient Instructions (Addendum)
If you are age 30 or older, your body mass index should be between 23-30. Your Body mass index is 24.34 kg/m. If this is out of the aforementioned range listed, please consider follow up with your Primary Care Provider.  If you are age 21 or younger, your body mass index should be between 19-25. Your Body mass index is 24.34 kg/m. If this is out of the aformentioned range listed, please consider follow up with your Primary Care Provider.   Try OTC Methycellulose as directed on the label.  You may contact [facility] (Rosario Adie, Registered Dietician) at [phone]  Dr. Tarri Glenn would like to see in 3-4 months.

## 2019-11-10 NOTE — Progress Notes (Signed)
Referring Provider: Janyth Contes, * Primary Care Physician:  Janyth Contes, MD  Reason for Consultation:  Abdominal pain   IMPRESSION:  Abdominal pain, alternating constipation and diarrhea, with gas and bloating Recent delivery of twins  Recent clinical improvement on gluten free, dairy free, and soy free diet.  No alarm features.  Many questions/concerns about food allergies.  PLAN: - Trial of methycellulose - Minimize red meat - Discussed food allergies - food diary recommended - Discussed referral to [place] for nutrition counseling (? Celiac center due to quality of nutrition counseling) - Recommended FiberFueled - Low threshold to pursue additional imaging +/- endoscopy if symptoms persist - Follow-up in 3-4 months, earlier if needed or if symptoms worsen  Please see the "Patient Instructions" section for addition details about the plan.  HPI: Jenna Rhodes is a 29 y.o. female referred by Dr. Terri Piedra for further evaluation of abdominal pain. The history is obtained through the patient, review of her electronic health record, and records provided by Dr. Terri Piedra. Currently at home with her twins. Recently worked as a Scientist, forensic for [employer]. Was furlowed during Covid. Husband just started a new job [employer].   Followed a gluten free diet with minimal dairy prior to her pregnancy on the recommendation of an ENT several years ago.   Developed RUQ or LUQ abdominal pain following delivery of her twins 10 months ago with alternating diarrhea and constipation. Associated malodorous stools and bloating.  Twins are now 74 months old.   Feels tired after having a bowel movement. Feels like she's still contracting and has a "scooped out" feeling. No blood or mucous.   Feeling a little better this week or last week. Doesn't want to be stuck in the bathroom. Prior bowel habits are at least 1-2 BM daily.  Eating gluten free, dairy free, and soy free due to  feeding issues with her twins x 3-4 months. She is wondering if Domenic Polite is the culprit.  Pediatrician recommended adding gluten free and daily in to her diet prior to introduction.    Initially thought it could be musculoskeletal. However, the pain persists despite having an improvement in her energy.She's worried about having a parasite or a bacterial infection. Breastfeeding and doesn't want to take any medications.   Previously took digestive enzymes with probiotics. Now just taking probiotics.   Evaluated by OB. Pelvic ultrasound was negative. Was also told by the OB that her pain was too high to be related to OB issues.   Planning to breast feed until age 26. Sleeping 7-8 hours every night while the twins are sleeping 11 hours night.   No known family history of GI disease. No known family history of colon cancer or polyps. Paternal grandmother with stomach cancer. No family history of uterine/endometrial cancer, pancreatic cancer or gastric/stomach cancer.    Past Medical History:  Diagnosis Date  . Allergic state 08/17/2016  . Allergy    pt. reported seasonal allergies  . Encounter for screening and preventative care 08/17/2016  . Gastro-esophageal reflux 08/17/2016  . Hyperlipidemia 08/30/2016  . Myalgia 08/17/2016  . Norovirus   . Palpitations 09/21/2016   s/p stopping birth control pills    Past Surgical History:  Procedure Laterality Date  . MANDIBLE SURGERY  08/28/2002  . WISDOM TOOTH EXTRACTION      Current Outpatient Medications  Medication Sig Dispense Refill  . acetaminophen (TYLENOL) 325 MG tablet Take 2 tablets (650 mg total) by mouth every 4 (four) hours as  needed (for pain scale < 4). 30 tablet 1  . ibuprofen (ADVIL) 600 MG tablet Take 1 tablet (600 mg total) by mouth every 6 (six) hours. 30 tablet 0  . Prenatal Vit-Fe Fumarate-FA (MULTIVITAMIN-PRENATAL) 27-0.8 MG TABS tablet Take 1 tablet by mouth daily at 12 noon.     No current facility-administered  medications for this visit.    Allergies as of 11/10/2019 - Review Complete 11/10/2019  Allergen Reaction Noted  . Amoxicillin Rash 08/16/2016  . Clindamycin/lincomycin Rash 08/17/2016  . Mobic [meloxicam] Rash 08/17/2016    Family History  Problem Relation Age of Onset  . Parkinson's disease Father   . Diabetes Maternal Grandmother   . Hypertension Maternal Grandmother   . Breast cancer Maternal Grandmother   . Stomach cancer Paternal Grandmother   . Lymphoma Paternal Grandmother   . Neuropathy Paternal Grandfather   . Hearing loss Paternal Grandfather        s/p bypass  . Prostate cancer Paternal Grandfather        prostate    Social History   Socioeconomic History  . Marital status: Married    Spouse name: Not on file  . Number of children: Not on file  . Years of education: Not on file  . Highest education level: Not on file  Occupational History  . Not on file  Tobacco Use  . Smoking status: Never Smoker  . Smokeless tobacco: Never Used  Substance and Sexual Activity  . Alcohol use: Not Currently    Alcohol/week: 2.0 standard drinks    Types: 2 Glasses of wine per week  . Drug use: No  . Sexual activity: Yes    Partners: Male    Birth control/protection: Pill  Other Topics Concern  . 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 Difficulty of Paying Living Expenses:   Food Insecurity:   . Worried About Charity fundraiser in the Last Year:   . Arboriculturist in the Last Year:   Transportation Needs:   . Film/video editor (Medical):   Marland Kitchen Lack of Transportation (Non-Medical):   Physical Activity:   . Days of Exercise per Week:   . Minutes of Exercise per Session:   Stress:   . Feeling of Stress :   Social Connections:   . Frequency of Communication with Friends and Family:   .  Frequency of Social Gatherings with Friends and Family:   . Attends Religious Services:   . Active Member of Clubs or Organizations:   . Attends Archivist Meetings:   Marland Kitchen Marital Status:   Intimate Partner Violence:   . Fear of Current or Ex-Partner:   . Emotionally Abused:   Marland Kitchen Physically Abused:   . Sexually Abused:     Review of Systems: 12 system ROS is negative except as noted above.   Physical Exam: General:   Alert,  well-nourished, pleasant and cooperative in NAD Head:  Normocephalic and atraumatic. Eyes:  Sclera clear, no icterus.   Conjunctiva pink. Ears:  Normal auditory acuity. Nose:  No deformity, discharge,  or lesions. Mouth:  No deformity or lesions.   Neck:  Supple; no masses or thyromegaly. Lungs:  Clear throughout to auscultation.   No wheezes. Heart:  Regular rate and rhythm; no murmurs. Abdomen:  Soft,nontender, nondistended, normal bowel sounds, no rebound or guarding. No hepatosplenomegaly.   Rectal:  Deferred  Msk:  Symmetrical. No boney deformities LAD: No inguinal or umbilical LAD Extremities:  No clubbing or edema. Neurologic:  Alert and  oriented x4;  grossly nonfocal Skin:  Intact without significant lesions or rashes. Psych:  Alert and cooperative. Normal mood and affect.     Kambre Messner L. Tarri Glenn, MD, MPH 11/10/2019, 2:14 PM

## 2019-11-14 ENCOUNTER — Encounter: Payer: Self-pay | Admitting: Gastroenterology

## 2020-01-19 ENCOUNTER — Ambulatory Visit: Payer: 59 | Admitting: Family

## 2020-01-19 ENCOUNTER — Encounter: Payer: Self-pay | Admitting: Family

## 2020-01-19 ENCOUNTER — Other Ambulatory Visit: Payer: Self-pay

## 2020-01-19 VITALS — BP 116/76 | HR 89 | Temp 97.3°F | Resp 16 | Ht 73.0 in | Wt 181.0 lb

## 2020-01-19 DIAGNOSIS — D229 Melanocytic nevi, unspecified: Secondary | ICD-10-CM

## 2020-01-19 DIAGNOSIS — I8392 Asymptomatic varicose veins of left lower extremity: Secondary | ICD-10-CM | POA: Diagnosis not present

## 2020-01-19 NOTE — Patient Instructions (Addendum)
Please purchase over the counter compression stockings and use as you are able during the day.  You should be contacted about scheduling your appointment with the dermatologist for skin check.   Varicose Veins Varicose veins are veins that have become enlarged, bulged, and twisted. They most often appear in the legs. What are the causes? This condition is caused by damage to the valves in the vein. These valves help blood return to your heart. When they are damaged and they stop working properly, blood may flow backward and back up in the veins near the skin, causing the veins to get larger and appear twisted. The condition can result from any issue that causes blood to back up, like pregnancy, prolonged standing, or obesity. What increases the risk? This condition is more likely to develop in people who are:  On their feet a lot.  Pregnant.  Overweight. What are the signs or symptoms? Symptoms of this condition include:  Bulging, twisted, and bluish veins.  A feeling of heaviness. This may be worse at the end of the day.  Leg pain. This may be worse at the end of the day.  Swelling in the leg.  Changes in skin color over the veins. How is this diagnosed? This condition may be diagnosed based on your symptoms, a physical exam, and an ultrasound test. How is this treated? Treatment for this condition may involve:  Avoiding sitting or standing in one position for long periods of time.  Wearing compression stockings. These stockings help to prevent blood clots and reduce swelling in the legs.  Raising (elevating) the legs when resting.  Losing weight.  Exercising regularly. If you have persistent symptoms or want to improve the way your varicose veins look, you may choose to have a procedure to close the varicose veins off or to remove them. Treatments to close off the veins include:  Sclerotherapy. In this treatment, a solution is injected into a vein to close it  off.  Laser treatment. In this treatment, the vein is heated with a laser to close it off.  Radiofrequency vein ablation. In this treatment, an electrical current produced by radio waves is used to close off the vein. Treatments to remove the veins include:  Phlebectomy. In this treatment, the veins are removed through small incisions made over the veins.  Vein ligation and stripping. In this treatment, incisions are made over the veins. The veins are then removed after being tied (ligated) with stitches (sutures). Follow these instructions at home: Activity  Walk as much as possible. Walking increases blood flow. This helps blood return to the heart and takes pressure off your veins. It also increases your cardiovascular strength.  Follow your health care provider's instructions about exercising.  Do not stand or sit in one position for a long period of time.  Do not sit with your legs crossed.  Rest with your legs raised during the day. General instructions   Follow any diet instructions given to you by your health care provider.  Wear compression stockings as directed by your health care provider. Do not wear other kinds of tight clothing around your legs, pelvis, or waist.  Elevate your legs at night to above the level of your heart.  If you get a cut in the skin over the varicose vein and the vein bleeds: ? Lie down with your leg raised. ? Apply firm pressure to the cut with a clean cloth until the bleeding stops. ? Place a bandage (dressing) on  the cut. Contact a health care provider if:  The skin around your varicose veins starts to break down.  You have pain, redness, tenderness, or hard swelling over a vein.  You are uncomfortable because of pain.  You get a cut in the skin over a varicose vein and it will not stop bleeding. Summary  Varicose veins are veins that have become enlarged, bulged, and twisted. They most often appear in the legs.  This condition is  caused by damage to the valves in the vein. These valves help blood return to your heart.  Treatment for this condition includes frequent movements, wearing compression stockings, losing weight, and exercising regularly. In some cases, procedures are done to close off or remove the veins.  Treatment for this condition may include wearing compression stockings, elevating the legs, losing weight, and engaging in regular activity. In some cases, procedures are done to close off or remove the veins. This information is not intended to replace advice given to you by your health care provider. Make sure you discuss any questions you have with your health care provider. Document Revised: 10/10/2018 Document Reviewed: 09/06/2016 Elsevier Patient Education  [place].

## 2020-01-19 NOTE — Progress Notes (Signed)
Subjective:    Patient ID: Jenna Rhodes, female    DOB: [birth year], 29 y.o.   MRN: [id number]  HPI  Patient is a 29 yr old female who presents today with two concerns:  1) Varicose Veins-c/o bilateral leg heaviness/pain at the end of the day.  Gave birth to twin girls 1 year ago and notes that she noticed varicose veins in her left leg following their birth.   2) Nevus- reports itching mole on her back.   3) pimple in ear- notes that it keep scabbing over.   Review of Systems    see HPI  Past Medical History:  Diagnosis Date  . Allergic state 08/17/2016  . Allergy    pt. reported seasonal allergies  . Encounter for screening and preventative care 08/17/2016  . Gastro-esophageal reflux 08/17/2016  . Hyperlipidemia 08/30/2016  . Myalgia 08/17/2016  . Norovirus   . Palpitations 09/21/2016   s/p stopping birth control pills     Social History   Socioeconomic History  . Marital status: Married    Spouse name: Not on file  . Number of children: 2  . Years of education: Not on file  . Highest education level: Not on file  Occupational History  . Occupation: stay at home mom  Tobacco Use  . Smoking status: Never Smoker  . Smokeless tobacco: Never Used  Substance and Sexual Activity  . Alcohol use: Not Currently    Alcohol/week: 2.0 standard drinks    Types: 2 Glasses of wine per week  . Drug use: No  . Sexual activity: Yes    Partners: Male    Birth control/protection: Pill  Other Topics Concern  . 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 Difficulty of Paying Living Expenses:   Food Insecurity:   . Worried About Charity fundraiser in the Last Year:   . Arboriculturist in the Last Year:   Transportation Needs:   . Film/video editor (Medical):   Marland Kitchen Lack of Transportation  (Non-Medical):   Physical Activity:   . Days of Exercise per Week:   . Minutes of Exercise per Session:   Stress:   . Feeling of Stress :   Social Connections:   . Frequency of Communication with Friends and Family:   . Frequency of Social Gatherings with Friends and Family:   . Attends Religious Services:   . Active Member of Clubs or Organizations:   . Attends Archivist Meetings:   Marland Kitchen Marital Status:   Intimate Partner Violence:   . Fear of Current or Ex-Partner:   . Emotionally Abused:   Marland Kitchen Physically Abused:   . Sexually Abused:     Past Surgical History:  Procedure Laterality Date  . MANDIBLE SURGERY  08/28/2002  . WISDOM TOOTH EXTRACTION      Family History  Problem Relation Age of Onset  . Lung cancer Mother   . Parkinson's disease Father   . Diabetes Maternal Grandmother   . Hypertension Maternal Grandmother   . Breast cancer Maternal Grandmother   . Stomach cancer Paternal Grandmother   . Lymphoma Paternal Grandmother   . Neuropathy Paternal Grandfather   . Hearing loss Paternal Grandfather        s/p bypass  . Prostate cancer Paternal  Grandfather     Allergies  Allergen Reactions  . Amoxicillin Rash    Did it involve swelling of the face/tongue/throat, SOB, or low BP? No Did it involve sudden or severe rash/hives, skin peeling, or any reaction on the inside of your mouth or nose? Yes Did you need to seek medical attention at a hospital or doctor's office? No When did it last happen?within last 10 years If all above answers are "NO", may proceed with cephalosporin use.   . Clindamycin/Lincomycin Rash  . Mobic [Meloxicam] Rash    Current Outpatient Medications on File Prior to Visit  Medication Sig Dispense Refill  . Prenatal Vit-Fe Fumarate-FA (MULTIVITAMIN-PRENATAL) 27-0.8 MG TABS tablet Take 1 tablet by mouth daily at 12 noon.    . Probiotic Product (PROBIOTIC PO) Take 2 capsules by mouth daily.     No current facility-administered  medications on file prior to visit.    BP 116/76 (BP Location: Right Arm, Patient Position: Sitting, Cuff Size: Small)   Pulse 89   Temp (!) 97.3 F (36.3 C) (Temporal)   Resp 16   Ht 6\' 1"  (1.854 m)   Wt 181 lb (82.1 kg)   SpO2 100%   BMI 23.88 kg/m    Objective:   Physical Exam Constitutional:      Appearance: She is well-developed.  Neck:     Thyroid: No thyromegaly.  Cardiovascular:     Rate and Rhythm: Normal rate and regular rhythm.     Heart sounds: Normal heart sounds. No murmur.  Pulmonary:     Effort: Pulmonary effort is normal. No respiratory distress.     Breath sounds: Normal breath sounds. No wheezing.  Musculoskeletal:     Cervical back: Neck supple.  Skin:    General: Skin is warm and dry.     Comments: Brown nevus noted overlying right scapula, brown with darker brown spot in middle. Multiple other freckles/nevi scattered on trunk.  Small dry appearing lesion in the left ear just outside canal  Neurological:     Mental Status: She is alert and oriented to person, place, and time.  Psychiatric:        Behavior: Behavior normal.        Thought Content: Thought content normal.        Judgment: Judgment normal.   vasc:  Superficial varicose veins noted left LE        Assessment & Plan:  Nevus- will refer to dermatology for skin evaluation.  Varicose veins- new. suspect venous insufficiency contributing to her leg pain. Suggested that she wear compression hose 15-20 mmH20, as able throughout the day. These can be purchased at [email or web address].  Advised tylenol as needed for pain, elevate legs as able during the day. If symptoms worsen or fail to improve, would consider referral to vascular.   This visit occurred during the SARS-CoV-2 public health emergency.  Safety protocols were in place, including screening questions prior to the visit, additional usage of staff PPE, and extensive cleaning of exam room while observing appropriate contact time as indicated  for disinfecting solutions.

## 2020-01-28 ENCOUNTER — Ambulatory Visit: Payer: 59

## 2020-01-28 ENCOUNTER — Ambulatory Visit (INDEPENDENT_AMBULATORY_CARE_PROVIDER_SITE_OTHER): Payer: 59 | Admitting: Medical

## 2020-01-28 ENCOUNTER — Other Ambulatory Visit: Payer: Self-pay

## 2020-01-28 VITALS — BP 126/82 | HR 70 | Ht 73.0 in | Wt 185.0 lb

## 2020-01-28 DIAGNOSIS — I8392 Asymptomatic varicose veins of left lower extremity: Secondary | ICD-10-CM | POA: Diagnosis not present

## 2020-01-28 DIAGNOSIS — R5383 Other fatigue: Secondary | ICD-10-CM

## 2020-01-28 DIAGNOSIS — M2550 Pain in unspecified joint: Secondary | ICD-10-CM

## 2020-01-28 DIAGNOSIS — M6740 Ganglion, unspecified site: Secondary | ICD-10-CM

## 2020-01-28 DIAGNOSIS — M791 Myalgia, unspecified site: Secondary | ICD-10-CM

## 2020-01-28 LAB — VITAMIN B12: Vitamin B-12: 330 pg/mL (ref 211–911)

## 2020-01-28 LAB — C-REACTIVE PROTEIN: CRP: <1 mg/dL (ref 0.5–20.0)

## 2020-01-28 LAB — TSH: TSH: 1.84 u[IU]/mL (ref 0.35–4.50)

## 2020-01-28 LAB — SEDIMENTATION RATE: Sed Rate: 10 mm/hr (ref 0–20)

## 2020-01-28 NOTE — Progress Notes (Signed)
Subjective:    Patient ID: Jenna Rhodes, female    DOB: Jul 07, 1991, 29 y.o.   MRN: [id number]  HPI  Pt in for some reported swelling and redness to some veins. Pt bought some compression socks. She uses them about every other day for about one week and half. She might get another pair. She states swelling of legs have occurred since after birth of twins but tender vein is new.   Pt states every morning when she wakes has achiness to lower ext calfs to feet. Note pain is symmetric. Sometimes has thigh pain as well. Correlates with being on feet long time standing.   Pt has twins 69 months old. She has plans to stop nursing when they are ready.   Her babies are sleeping 12 hours.   Legs are aching more when standing.   LMP- one month ago.   She has small subtle lump on left calf that she noticed today.   Jogging some recently. Also pushes her children in stroller.   Recently dilated vein back of left knee. Present since birth of babies.   Review of Systems  Constitutional: Negative for chills and fatigue.  Respiratory: Negative for cough, chest tightness, shortness of breath and wheezing.   Cardiovascular: Negative for chest pain and palpitations.  Musculoskeletal: Positive for arthralgias and myalgias.  Skin: Negative for rash.  Neurological: Negative for dizziness, weakness and headaches.  Hematological: Negative for adenopathy. Does not bruise/bleed easily.  Psychiatric/Behavioral: Negative for behavioral problems and sleep disturbance. The patient is not nervous/anxious.     Past Medical History:  Diagnosis Date  . Allergic state 08/17/2016  . Allergy    pt. reported seasonal allergies  . Encounter for screening and preventative care 08/17/2016  . Gastro-esophageal reflux 08/17/2016  . Hyperlipidemia 08/30/2016  . Myalgia 08/17/2016  . Norovirus   . Palpitations 09/21/2016   s/p stopping birth control pills     Social History   Socioeconomic History  .  Marital status: Married    Spouse name: Not on file  . Number of children: 2  . Years of education: Not on file  . Highest education level: Not on file  Occupational History  . Occupation: stay at home mom  Tobacco Use  . Smoking status: Never Smoker  . Smokeless tobacco: Never Used  Substance and Sexual Activity  . Alcohol use: Not Currently    Alcohol/week: 2.0 standard drinks    Types: 2 Glasses of wine per week  . Drug use: No  . Sexual activity: Yes    Partners: Male    Birth control/protection: Pill  Other Topics Concern  . 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 Difficulty of Paying Living Expenses:   Food Insecurity:   . Worried About Charity fundraiser in the Last Year:   . Arboriculturist in the Last Year:   Transportation Needs:   . Film/video editor (Medical):   Marland Kitchen Lack of Transportation (Non-Medical):   Physical Activity:   . Days of Exercise per Week:   . Minutes of Exercise per Session:   Stress:   . Feeling of Stress :   Social Connections:   . Frequency of Communication with Friends and Family:   . Frequency of Social Gatherings with Friends  and Family:   . Attends Religious Services:   . Active Member of Clubs or Organizations:   . Attends Archivist Meetings:   Marland Kitchen Marital Status:   Intimate Partner Violence:   . Fear of Current or Ex-Partner:   . Emotionally Abused:   Marland Kitchen Physically Abused:   . Sexually Abused:     Past Surgical History:  Procedure Laterality Date  . MANDIBLE SURGERY  08/28/2002  . WISDOM TOOTH EXTRACTION      Family History  Problem Relation Age of Onset  . Lung cancer Mother   . Parkinson's disease Father   . Diabetes Maternal Grandmother   . Hypertension Maternal Grandmother   . Breast cancer Maternal Grandmother   . Stomach cancer  Paternal Grandmother   . Lymphoma Paternal Grandmother   . Neuropathy Paternal Grandfather   . Hearing loss Paternal Grandfather        s/p bypass  . Prostate cancer Paternal Grandfather     Allergies  Allergen Reactions  . Amoxicillin Rash    Did it involve swelling of the face/tongue/throat, SOB, or low BP? No Did it involve sudden or severe rash/hives, skin peeling, or any reaction on the inside of your mouth or nose? Yes Did you need to seek medical attention at a hospital or doctor's office? No When did it last happen?within last 10 years If all above answers are "NO", may proceed with cephalosporin use.   . Clindamycin/Lincomycin Rash  . Mobic [Meloxicam] Rash    Current Outpatient Medications on File Prior to Visit  Medication Sig Dispense Refill  . Prenatal Vit-Fe Fumarate-FA (MULTIVITAMIN-PRENATAL) 27-0.8 MG TABS tablet Take 1 tablet by mouth daily at 12 noon.    . Probiotic Product (PROBIOTIC PO) Take 2 capsules by mouth daily.     No current facility-administered medications on file prior to visit.    BP 126/82 (BP Location: Left Arm, Patient Position: Sitting, Cuff Size: Normal)   Pulse 70   Ht 6\' 1"  (1.854 m)   Wt 185 lb (83.9 kg)   SpO2 98%   BMI 24.41 kg/m       Objective:   Physical Exam General- No acute distress. Pleasant patient. Lungs- Clear, even and unlabored. Heart- regular rate and rhythm. Neurologic- CNII- XII grossly intact.  Lt ower ext- dilated veins upper calf tha are mild tender. Lateral  Distal calf small.spherical bulge present  Rt calf- no dilated veins. Not tender. No dilated veins.       Assessment & Plan:  You do have recent tender and dilated superficial veins/varicose veins left calf region. Reports some swelling recently as well. Decided to go ahead and get left lower extremity ultrasound to evaluate superficial veins and deep veins. If deep vein showed no abnormality then would recommend warm compresses twice daily to  superficial vein area and you could use Tylenol or low-dose ibuprofen for pain. If your superficial vein pain persist then would consult with your PCP to see if she wants to refer you to a vascular surgeon.  Since you do describe some achiness in lower leg muscles and feet pain, did go ahead and put inflammatory lab studies to be done today or later if necessary.  Decided to go ahead and referring to sports medicine to evaluate left lateral calf slight bulge. This might represent ganglion cyst.  I do recommend that you continue with compression socks as much as possible. Particularly when you are at home.  Follow-up date to be determined after lab  and image review.  Mackie Pai, PA-C   Time spent with patient today was  35 minutes which consisted of chart review, discussing differential diagnoeis, work up, treatment and documentation.

## 2020-01-28 NOTE — Patient Instructions (Signed)
You do have recent tender and dilated superficial veins/varicose veins left calf region. Reports some swelling recently as well. Decided to go ahead and get left lower extremity ultrasound to evaluate superficial veins and deep veins. If deep vein showed no abnormality then would recommend warm compresses twice daily to superficial vein area and you could use Tylenol or low-dose ibuprofen for pain. If your superficial vein pain persist then would consult with your PCP to see if she wants to refer you to a vascular surgeon.  Since you do describe some achiness in lower leg muscles and feet pain, did go ahead and put inflammatory lab studies to be done today or later if necessary.  Decided to go ahead and referring to sports medicine to evaluate left lateral calf slight bulge. This might represent ganglion cyst.  I do recommend that you continue with compression socks as much as possible. Particularly when you are at home.  Follow-up date to be determined after lab and image review.

## 2020-01-30 ENCOUNTER — Ambulatory Visit: Payer: 59 | Admitting: Family Medicine

## 2020-02-02 ENCOUNTER — Other Ambulatory Visit: Payer: Self-pay

## 2020-02-02 ENCOUNTER — Telehealth: Payer: Self-pay | Admitting: Family

## 2020-02-02 ENCOUNTER — Ambulatory Visit: Payer: 59 | Admitting: Family Medicine

## 2020-02-02 ENCOUNTER — Ambulatory Visit: Payer: Self-pay

## 2020-02-02 ENCOUNTER — Encounter: Payer: Self-pay | Admitting: Family Medicine

## 2020-02-02 VITALS — BP 119/85 | HR 80 | Ht 73.0 in | Wt 185.0 lb

## 2020-02-02 DIAGNOSIS — M79604 Pain in right leg: Secondary | ICD-10-CM | POA: Diagnosis not present

## 2020-02-02 DIAGNOSIS — M79605 Pain in left leg: Secondary | ICD-10-CM

## 2020-02-02 DIAGNOSIS — M25572 Pain in left ankle and joints of left foot: Secondary | ICD-10-CM

## 2020-02-02 DIAGNOSIS — R768 Other specified abnormal immunological findings in serum: Secondary | ICD-10-CM

## 2020-02-02 LAB — VITAMIN D 1,25 DIHYDROXY
Vitamin D 1, 25 (OH)2 Total: 85 pg/mL — ABNORMAL HIGH (ref 18–72)
Vitamin D2 1, 25 (OH)2: <8 pg/mL
Vitamin D3 1, 25 (OH)2: 85 pg/mL

## 2020-02-02 LAB — ANA: Anti Nuclear Antibody (ANA): POSITIVE — AB

## 2020-02-02 LAB — VITAMIN B1: Vitamin B1 (Thiamine): 24 nmol/L (ref 8–30)

## 2020-02-02 LAB — ANTI-NUCLEAR AB-TITER (ANA TITER): ANA Titer 1: 1:80 {titer} — ABNORMAL HIGH

## 2020-02-02 LAB — RHEUMATOID FACTOR: Rheumatoid fact SerPl-aCnc: <14 IU/mL (ref <14)

## 2020-02-02 NOTE — Patient Instructions (Signed)
Nice to meet you Please try the exercises  Please try compression   I will call with the results from today  Please send me a message in MyChart with any questions or updates.  Please see me back in 4 weeks.   --Dr. Raeford Razor

## 2020-02-02 NOTE — Assessment & Plan Note (Signed)
There appears to be a fascial herniation in the area that she notices the protrusion.  Does not appear to be a cyst or tendinitis.  Doppler was negative for DVT. -Counseled on home exercise therapy and supportive care. -Counseled on compression. -If worsening could consider further imaging.

## 2020-02-02 NOTE — Assessment & Plan Note (Addendum)
Unclear if the vague leg pain is related to possible iron deficiency versus hypermobility versus possible rheumatologic condition.  Her ANA was weakly positive of the most recent test.  She is not demonstrating joint aches or pains.  She does demonstrate hypermobility in certain joints but not all joints.  She also had a vaginal delivery of twins which could contribute pelvic instability with her starting to run again. -Counseled on home exercise therapy and supportive care. -CBC and iron and ferritin. -Would repeat ANA panel in 3 to 6 months. -Could consider insoles

## 2020-02-02 NOTE — Progress Notes (Signed)
Jenna Rhodes - 29 y.o. female MRN [id number]  Date of birth: 1990/12/12  SUBJECTIVE:  Including CC & ROS.  Chief Complaint  Patient presents with  . Leg Pain    left calf  . Ankle Pain    left    Jenna Rhodes is a 29 y.o. female that is presenting with vague achiness of her legs as well as a protrusion of the left lower leg.  Her symptoms been ongoing when she started running.  She had vaginal delivery of twins last May.  She is having vague leg pain that seems to be worse with running.  No specific area of tenderness.  She does have a mild deformity in the left lower leg.  No history of trauma.  She has had a history of ankle fracture in the left.  No history of surgery.   Review of Systems See HPI   HISTORY: Past Medical, Surgical, Social, and Family History Reviewed & Updated per EMR.   Pertinent Historical Findings include:  Past Medical History:  Diagnosis Date  . Allergic state 08/17/2016  . Allergy    pt. reported seasonal allergies  . Encounter for screening and preventative care 08/17/2016  . Gastro-esophageal reflux 08/17/2016  . Hyperlipidemia 08/30/2016  . Myalgia 08/17/2016  . Norovirus   . Palpitations 09/21/2016   s/p stopping birth control pills    Past Surgical History:  Procedure Laterality Date  . MANDIBLE SURGERY  08/28/2002  . WISDOM TOOTH EXTRACTION      Family History  Problem Relation Age of Onset  . Lung cancer Mother   . Parkinson's disease Father   . Diabetes Maternal Grandmother   . Hypertension Maternal Grandmother   . Breast cancer Maternal Grandmother   . Stomach cancer Paternal Grandmother   . Lymphoma Paternal Grandmother   . Neuropathy Paternal Grandfather   . Hearing loss Paternal Grandfather        s/p bypass  . Prostate cancer Paternal Grandfather     Social History   Socioeconomic History  . Marital status: Married    Spouse name: Not on file  . Number of children: 2  . Years of education: Not on file    . Highest education level: Not on file  Occupational History  . Occupation: stay at home mom  Tobacco Use  . Smoking status: Never Smoker  . Smokeless tobacco: Never Used  Substance and Sexual Activity  . Alcohol use: Not Currently    Alcohol/week: 2.0 standard drinks    Types: 2 Glasses of wine per week  . Drug use: No  . Sexual activity: Yes    Partners: Male    Birth control/protection: Pill  Other Topics Concern  . 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 Difficulty of Paying Living Expenses:   Food Insecurity:   . Worried About Charity fundraiser in the Last Year:   . Arboriculturist in the Last Year:   Transportation Needs:   . Film/video editor (Medical):   Marland Kitchen Lack of Transportation (Non-Medical):   Physical Activity:   . Days of Exercise per Week:   . Minutes of Exercise per Session:   Stress:   . Feeling of Stress :   Social Connections:   . Frequency of Communication with  Friends and Family:   . Frequency of Social Gatherings with Friends and Family:   . Attends Religious Services:   . Active Member of Clubs or Organizations:   . Attends Archivist Meetings:   Marland Kitchen Marital Status:   Intimate Partner Violence:   . Fear of Current or Ex-Partner:   . Emotionally Abused:   Marland Kitchen Physically Abused:   . Sexually Abused:      PHYSICAL EXAM:  VS: BP 119/85   Pulse 80   Ht 6\' 1"  (1.854 m)   Wt 185 lb (83.9 kg)   BMI 24.41 kg/m  Physical Exam Gen: NAD, alert, cooperative with exam, well-appearing MSK:  Has good stability with single leg standing and single leg standing with hip abduction and flexion. Left lower leg is demonstrating a mild defect on dorsiflexion. No weakness in the left ankle. Normal range of motion. Demonstrates hypermobility of the bilateral elbows. Able to  place hands flat on floor. Neurovascularly intact  Limited ultrasound: Left leg/ankle:  On dorsiflexion of the foot it appears that there is a mild fascial herniation in the area that she is demonstrating this protrusion.  No changes of the musculature structurally or of the tibia. No effusion in the ankle joint. Normal-appearing peroneal tendons. Normal-appearing lateral malleolus.  Summary: Findings would suggest fascial herniation.  Ultrasound and interpretation by Clearance Coots, MD    ASSESSMENT & PLAN:   Pain in both lower extremities Unclear if the vague leg pain is related to possible iron deficiency versus hypermobility versus possible rheumatologic condition.  Her ANA was weakly positive of the most recent test.  She is not demonstrating joint aches or pains.  She does demonstrate hypermobility in certain joints but not all joints.  She also had a vaginal delivery of twins which could contribute pelvic instability with her starting to run again. -Counseled on home exercise therapy and supportive care. -CBC and iron and ferritin. -Would repeat ANA panel in 3 to 6 months. -Could consider insoles  Acute left ankle pain There appears to be a fascial herniation in the area that she notices the protrusion.  Does not appear to be a cyst or tendinitis.  Doppler was negative for DVT. -Counseled on home exercise therapy and supportive care. -Counseled on compression. -If worsening could consider further imaging.

## 2020-02-02 NOTE — Telephone Encounter (Signed)
Please advise pt that her ANA test was abnormal.This is an autoimmune test. I would like for her to meet with rheumatology for further evaluation.  Vitamin D level is high. OK to continue prenatal vitamin but make sure she isn't taking any other supplements with vitamin D.

## 2020-02-03 ENCOUNTER — Telehealth: Payer: Self-pay | Admitting: Family Medicine

## 2020-02-03 LAB — CBC
Hematocrit: 42.9 % (ref 34.0–46.6)
Hemoglobin: 14.4 g/dL (ref 11.1–15.9)
MCH: 30.4 pg (ref 26.6–33.0)
MCHC: 33.6 g/dL (ref 31.5–35.7)
MCV: 91 fL (ref 79–97)
Platelets: 316 10*3/uL (ref 150–450)
RBC: 4.74 x10E6/uL (ref 3.77–5.28)
RDW: 12.1 % (ref 11.7–15.4)
WBC: 7.8 10*3/uL (ref 3.4–10.8)

## 2020-02-03 LAB — IRON,TIBC AND FERRITIN PANEL
Ferritin: 124 ng/mL (ref 15–150)
Iron Saturation: 32 % (ref 15–55)
Iron: 103 ug/dL (ref 27–159)
Total Iron Binding Capacity: 319 ug/dL (ref 250–450)
UIBC: 216 ug/dL (ref 131–425)

## 2020-02-03 NOTE — Telephone Encounter (Signed)
Caller: Karolyne Call back phone number: [phone]  Returning your call

## 2020-02-03 NOTE — Telephone Encounter (Signed)
Informed of results.   Rosemarie Ax, MD [facility] 02/03/2020, 8:47 AM

## 2020-02-03 NOTE — Telephone Encounter (Signed)
Lvm  for patient to call back about results. 

## 2020-02-03 NOTE — Telephone Encounter (Signed)
Advised patient of results and referral. She agrees with plan. Referral signed. She reports she is not taking any other vit d supplements.

## 2020-02-06 ENCOUNTER — Encounter: Payer: Self-pay | Admitting: Family Medicine

## 2020-02-26 ENCOUNTER — Telehealth: Payer: Self-pay | Admitting: Family Medicine

## 2020-02-26 NOTE — Telephone Encounter (Signed)
Spoke with patient about her questions.   Rosemarie Ax, MD [facility] 02/26/2020, 1:24 PM

## 2020-03-02 ENCOUNTER — Ambulatory Visit: Payer: 59 | Admitting: Family Medicine

## 2020-04-06 ENCOUNTER — Other Ambulatory Visit: Payer: Self-pay

## 2020-04-06 ENCOUNTER — Ambulatory Visit: Payer: 59 | Admitting: Family Medicine

## 2020-04-06 ENCOUNTER — Encounter: Payer: Self-pay | Admitting: Family Medicine

## 2020-04-06 VITALS — BP 118/83 | HR 78 | Ht 73.0 in | Wt 185.0 lb

## 2020-04-06 DIAGNOSIS — M25572 Pain in left ankle and joints of left foot: Secondary | ICD-10-CM

## 2020-04-06 DIAGNOSIS — R768 Other specified abnormal immunological findings in serum: Secondary | ICD-10-CM | POA: Diagnosis not present

## 2020-04-06 DIAGNOSIS — R197 Diarrhea, unspecified: Secondary | ICD-10-CM | POA: Insufficient documentation

## 2020-04-06 DIAGNOSIS — M79604 Pain in right leg: Secondary | ICD-10-CM

## 2020-04-06 DIAGNOSIS — M79605 Pain in left leg: Secondary | ICD-10-CM

## 2020-04-06 NOTE — Progress Notes (Signed)
Jenna Rhodes - 29 y.o. female MRN [id number]  Date of birth: 1991/05/19  SUBJECTIVE:  Including CC & ROS.  Chief Complaint  Patient presents with  . Follow-up    left ankle    Jenna Rhodes is a 29 y.o. female that is following up for her leg pain as well as the change in the left lower leg.  She is also reporting diarrhea that has been intermittent since January.  Has been able to work out intermittently and does have improvement of her leg pain.  The bump in the lower leg seems less symptomatic.  Has had inconsistent diarrhea over the past 8 to 9 months.  Tends to be loose and nonbloody.  She has crampiness and has multiple episodes per day.  Does not have a specific trigger.  Has not taken anything for the symptoms.   Review of Systems See HPI   HISTORY: Past Medical, Surgical, Social, and Family History Reviewed & Updated per EMR.   Pertinent Historical Findings include:  Past Medical History:  Diagnosis Date  . Allergic state 08/17/2016  . Allergy    pt. reported seasonal allergies  . Encounter for screening and preventative care 08/17/2016  . Gastro-esophageal reflux 08/17/2016  . Hyperlipidemia 08/30/2016  . Myalgia 08/17/2016  . Norovirus   . Palpitations 09/21/2016   s/p stopping birth control pills    Past Surgical History:  Procedure Laterality Date  . MANDIBLE SURGERY  08/28/2002  . WISDOM TOOTH EXTRACTION      Family History  Problem Relation Age of Onset  . Lung cancer Mother   . Parkinson's disease Father   . Diabetes Maternal Grandmother   . Hypertension Maternal Grandmother   . Breast cancer Maternal Grandmother   . Stomach cancer Paternal Grandmother   . Lymphoma Paternal Grandmother   . Neuropathy Paternal Grandfather   . Hearing loss Paternal Grandfather        s/p bypass  . Prostate cancer Paternal Grandfather     Social History   Socioeconomic History  . Marital status: Married    Spouse name: Not on file  . Number of  children: 2  . Years of education: Not on file  . Highest education level: Not on file  Occupational History  . Occupation: stay at home mom  Tobacco Use  . Smoking status: Never Smoker  . Smokeless tobacco: Never Used  Vaping Use  . Vaping Use: Never used  Substance and Sexual Activity  . Alcohol use: Not Currently    Alcohol/week: 2.0 standard drinks    Types: 2 Glasses of wine per week  . Drug use: No  . Sexual activity: Yes    Partners: Male    Birth control/protection: Pill  Other Topics Concern  . 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 Difficulty of Paying Living Expenses:   Food Insecurity:   . Worried About Charity fundraiser in the Last Year:   . Arboriculturist in the Last Year:   Transportation Needs:   . Film/video editor (Medical):   Marland Kitchen Lack of Transportation (Non-Medical):   Physical Activity:   . Days of Exercise per Week:   . Minutes of Exercise per Session:   Stress:   . Feeling of Stress :   Social  Connections:   . Frequency of Communication with Friends and Family:   . Frequency of Social Gatherings with Friends and Family:   . Attends Religious Services:   . Active Member of Clubs or Organizations:   . Attends Archivist Meetings:   Marland Kitchen Marital Status:   Intimate Partner Violence:   . Fear of Current or Ex-Partner:   . Emotionally Abused:   Marland Kitchen Physically Abused:   . Sexually Abused:      PHYSICAL EXAM:  VS: BP 118/83   Pulse 78   Ht 6\' 1"  (1.854 m)   Wt 185 lb (83.9 kg)   BMI 24.41 kg/m  Physical Exam Gen: NAD, alert, cooperative with exam, well-appearing    ASSESSMENT & PLAN:   I spent 30 minutes with this patient, greater than 50% was face-to-face time counseling regarding the below diagnosis.   Acute left ankle pain The area suggestive of a  fascial herniation has become less symptomatic -Continue home exercises and counseled on supportive care. -Could consider MRI if needed.  Pain in both lower extremities Pain has improved with home exercise therapy and regimen. -Continue home therapy.  Positive ANA (antinuclear antibody) Was found to be positive a few months ago.  Has intermittent joint pains. -ANA panel -Has rheumatology scheduled in a little over a month.  Diarrhea Chronic in nature.  Does not have loose stools every day.  Can be associated with crampiness and abdominal pain.  Nonbloody in nature and does not appear to be infectious.  Unclear if this is associated with food. Possible for IBS - CMP and celiac panel. -Counseled on food diary. -May need to consider stool studies or follow-up with GI.

## 2020-04-06 NOTE — Assessment & Plan Note (Signed)
Was found to be positive a few months ago.  Has intermittent joint pains. -ANA panel -Has rheumatology scheduled in a little over a month.

## 2020-04-06 NOTE — Assessment & Plan Note (Signed)
Pain has improved with home exercise therapy and regimen. -Continue home therapy.

## 2020-04-06 NOTE — Patient Instructions (Signed)
Good to see you Please try to keep a food diary  I will call with the results from today   Please send me a message in MyChart with any questions or updates.  Please see me back in 2-3 months or as needed.   --Dr. Raeford Razor

## 2020-04-06 NOTE — Assessment & Plan Note (Signed)
Chronic in nature.  Does not have loose stools every day.  Can be associated with crampiness and abdominal pain.  Nonbloody in nature and does not appear to be infectious.  Unclear if this is associated with food. Possible for IBS - CMP and celiac panel. -Counseled on food diary. -May need to consider stool studies or follow-up with GI.

## 2020-04-06 NOTE — Assessment & Plan Note (Signed)
The area suggestive of a fascial herniation has become less symptomatic -Continue home exercises and counseled on supportive care. -Could consider MRI if needed.

## 2020-04-08 ENCOUNTER — Telehealth: Payer: Self-pay | Admitting: Family Medicine

## 2020-04-08 DIAGNOSIS — R768 Other specified abnormal immunological findings in serum: Secondary | ICD-10-CM

## 2020-04-08 LAB — COMPREHENSIVE METABOLIC PANEL
ALT: 15 IU/L (ref 0–32)
AST: 19 IU/L (ref 0–40)
Albumin/Globulin Ratio: 1.8 (ref 1.2–2.2)
Albumin: 4.9 g/dL (ref 3.9–5.0)
Alkaline Phosphatase: 142 IU/L — ABNORMAL HIGH (ref 48–121)
BUN/Creatinine Ratio: 17 (ref 9–23)
BUN: 14 mg/dL (ref 6–20)
Bilirubin Total: 0.5 mg/dL (ref 0.0–1.2)
CO2: 25 mmol/L (ref 20–29)
Calcium: 10.2 mg/dL (ref 8.7–10.2)
Chloride: 100 mmol/L (ref 96–106)
Creatinine, Ser: 0.82 mg/dL (ref 0.57–1.00)
GFR calc Af Amer: 111 mL/min/{1.73_m2} (ref >59)
GFR calc non Af Amer: 96 mL/min/{1.73_m2} (ref >59)
Globulin, Total: 2.8 g/dL (ref 1.5–4.5)
Glucose: 76 mg/dL (ref 65–99)
Potassium: 5 mmol/L (ref 3.5–5.2)
Sodium: 139 mmol/L (ref 134–144)
Total Protein: 7.7 g/dL (ref 6.0–8.5)

## 2020-04-08 LAB — ANA,IFA RA DIAG PNL W/RFLX TIT/PATN
ANA Titer 1: POSITIVE — AB
Cyclic Citrullin Peptide Ab: 3 units (ref 0–19)
Rhuematoid fact SerPl-aCnc: <10 IU/mL (ref 0.0–13.9)

## 2020-04-08 LAB — GLIA (IGA/G) + TTG IGA
Antigliadin Abs, IgA: 5 units (ref 0–19)
Gliadin IgG: 2 units (ref 0–19)
Transglutaminase IgA: <2 U/mL (ref 0–3)

## 2020-04-08 LAB — FANA STAINING PATTERNS
Homogeneous Pattern: 1:320 {titer} — ABNORMAL HIGH
Speckled Pattern: 1:80 {titer}

## 2020-04-08 NOTE — Telephone Encounter (Signed)
Patient called states wishes provider to contact her to discuss lab results.   --forwarding request to Dr. Raeford Razor.  -glh

## 2020-04-08 NOTE — Telephone Encounter (Signed)
Informed of results.  We will try to get into rheumatology sooner than later.  Rosemarie Ax, MD [facility] 04/08/2020, 5:19 PM

## 2020-04-09 NOTE — Telephone Encounter (Signed)
Faxed notes to Webb Silversmith at Dr Melissa Noon office at [phone] and she will call patient with an appointment. Will inform patient of their process and give her their contact [phone])

## 2020-04-13 ENCOUNTER — Telehealth: Payer: Self-pay | Admitting: Family Medicine

## 2020-04-13 NOTE — Telephone Encounter (Signed)
Referral made to [facility]--called [phone] to advise we are faxing pt's LOV notes, referral to [facility] & pt's ins card for appt scheduling as soon as possible.  --FYI.  --glh

## 2020-04-14 NOTE — Telephone Encounter (Signed)
Patient scheduled to see Dr Melissa Noon PA, Leafy Kindle, on 05/17/20 @ 8am. Patient has been informed and their office mailed her a new patient package.  [facility] [address] [place], [place] [address] Phone: [phone]

## 2020-05-05 ENCOUNTER — Ambulatory Visit: Payer: 59 | Admitting: Dermatology

## 2020-05-11 ENCOUNTER — Other Ambulatory Visit: Payer: Self-pay

## 2020-05-11 DIAGNOSIS — R1084 Generalized abdominal pain: Secondary | ICD-10-CM

## 2020-05-18 ENCOUNTER — Other Ambulatory Visit: Payer: Self-pay

## 2020-05-18 ENCOUNTER — Ambulatory Visit (HOSPITAL_COMMUNITY)
Admission: RE | Admit: 2020-05-18 | Discharge: 2020-05-18 | Disposition: A | Discharge status: Home / Self Care | Payer: 59 | Source: Ambulatory Visit | Attending: Gastroenterology | Admitting: Gastroenterology

## 2020-05-18 DIAGNOSIS — R1084 Generalized abdominal pain: Secondary | ICD-10-CM | POA: Diagnosis not present

## 2020-05-31 NOTE — Progress Notes (Deleted)
Office Visit Note  Patient: Jenna Rhodes             Date of Birth: [birth year]           MRN: [id number]             PCP: Debbrah Alar, NP Referring: Debbrah Alar, NP Visit Date: 06/09/2020 Occupation: _0 @  Subjective:  No chief complaint on file.   History of Present Illness: Jenna Rhodes is a 29 y.o. female ***   Activities of Daily Living:  Patient reports morning stiffness for *** {minute/hour:19697}.   Patient {ACTIONS;DENIES/REPORTS:21021675::"Denies"} nocturnal pain.  Difficulty dressing/grooming: {ACTIONS;DENIES/REPORTS:21021675::"Denies"} Difficulty climbing stairs: {ACTIONS;DENIES/REPORTS:21021675::"Denies"} Difficulty getting out of chair: {ACTIONS;DENIES/REPORTS:21021675::"Denies"} Difficulty using hands for taps, buttons, cutlery, and/or writing: {ACTIONS;DENIES/REPORTS:21021675::"Denies"}  No Rheumatology ROS completed.   PMFS History:  Patient Active Problem List   Diagnosis Date Noted  . Diarrhea 04/06/2020  . Positive ANA (antinuclear antibody) 04/06/2020  . Pain in both lower extremities 02/02/2020  . Acute left ankle pain 02/02/2020  . SVD (spontaneous vaginal delivery) 12/30/2018  . Postpartum care following vaginal delivery 12/30/2018  . Preterm premature rupture of membranes (PPROM) with unknown onset of labor 12/29/2018  . Abdominal pain 09/07/2017  . Hyperlipidemia 08/30/2016  . Allergic state 08/17/2016  . Gastro-esophageal reflux 08/17/2016  . Encounter for screening and preventative care 08/17/2016    Past Medical History:  Diagnosis Date  . Allergic state 08/17/2016  . Allergy    pt. reported seasonal allergies  . Encounter for screening and preventative care 08/17/2016  . Gastro-esophageal reflux 08/17/2016  . Hyperlipidemia 08/30/2016  . Myalgia 08/17/2016  . Norovirus   . Palpitations 09/21/2016   s/p stopping birth control pills    Family History  Problem Relation Age of Onset  . Lung cancer  Mother   . Parkinson's disease Father   . Diabetes Maternal Grandmother   . Hypertension Maternal Grandmother   . Breast cancer Maternal Grandmother   . Stomach cancer Paternal Grandmother   . Lymphoma Paternal Grandmother   . Neuropathy Paternal Grandfather   . Hearing loss Paternal Grandfather        s/p bypass  . Prostate cancer Paternal Grandfather    Past Surgical History:  Procedure Laterality Date  . MANDIBLE SURGERY  08/28/2002  . 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 Influenza-Unspecified 06/28/2018  . Tdap 12/22/2016     Objective: Vital Signs: There were no vitals taken for this visit.   Physical Exam   Musculoskeletal Exam: ***  CDAI Exam: CDAI Score: -- Patient Global: --; Provider Global: -- Swollen: --; Tender: -- Joint Exam 06/09/2020   No joint exam has been documented for this visit   There is currently no information documented on the homunculus. Go to the Rheumatology activity and complete the homunculus joint exam.  Investigation: No additional findings.  Imaging: US Abdomen Complete  Result Date: 05/18/2020 CLINICAL DATA:  Generalized abdominal pain EXAM: ABDOMEN ULTRASOUND COMPLETE COMPARISON:  None. FINDINGS: Gallbladder: No gallstones or wall thickening visualized. No sonographic Murphy sign noted by sonographer. Common bile duct: Diameter: 3.4 mm Liver: No focal lesion identified. Within normal limits in parenchymal echogenicity. Portal vein is patent on color Doppler imaging with normal direction of blood flow towards the liver. IVC: No abnormality visualized.  Pancreas: Visualized portion unremarkable. Spleen: Size and appearance within normal limits. Right Kidney: Length: 11.6 cm. Echogenicity within normal limits. No mass or hydronephrosis  visualized. Left Kidney: Length: 12.9 cm. Echogenicity within normal limits. No mass or hydronephrosis visualized. Abdominal aorta: No aneurysm visualized. Other findings: None. IMPRESSION: Normal abdominal ultrasound Electronically Signed   By: Jerilynn Mages.  Shick M.D.   On: 05/18/2020 09:44    Recent Labs: Lab Results  Component Value Date   WBC 7.8 02/02/2020   HGB 14.4 02/02/2020   PLT 316 02/02/2020   NA 139 04/06/2020   K 5.0 04/06/2020   CL 100 04/06/2020   CO2 25 04/06/2020   GLUCOSE 76 04/06/2020   BUN 14 04/06/2020   CREATININE 0.82 04/06/2020   BILITOT 0.5 04/06/2020   ALKPHOS 142 (H) 04/06/2020   AST 19 04/06/2020   ALT 15 04/06/2020   PROT 7.7 04/06/2020   ALBUMIN 4.9 04/06/2020   CALCIUM 10.2 04/06/2020   GFRAA 111 04/06/2020    Speciality Comments: No specialty comments available.  Procedures:  No procedures performed Allergies: Amoxicillin, Clindamycin/lincomycin, and Mobic [meloxicam]   Assessment / Plan:     Visit Diagnoses: Positive ANA (antinuclear antibody) - 01/28/20: ANA 1:80 NH, ESR 10, RF<14, CRP<1, Vitamin B12 330, Vitamin B1 24, TSH 1.84, Vitamin D 85. 04/06/20: ANA 1:320H, 1:80S, CCP 3, RF-  Pain in both lower extremities  History of gastroesophageal reflux (GERD)  History of hyperlipidemia  Orders: No orders of the defined types were placed in this encounter.  No orders of the defined types were placed in this encounter.   Face-to-face time spent with patient was *** minutes. Greater than 50% of time was spent in counseling and coordination of care.  Follow-Up Instructions: No follow-ups on file.   Ofilia Neas, PA-C  Note - This record has been created using Dragon software.  Chart creation errors have been sought, but may not always  have been located. Such creation errors do not reflect on  the standard of medical care.

## 2020-06-09 ENCOUNTER — Ambulatory Visit: Payer: 59 | Admitting: Rheumatology

## 2020-06-22 ENCOUNTER — Ambulatory Visit: Payer: 59 | Admitting: Gastroenterology

## 2020-07-01 ENCOUNTER — Ambulatory Visit: Payer: 59 | Admitting: Rheumatology

## 2020-07-14 ENCOUNTER — Telehealth (INDEPENDENT_AMBULATORY_CARE_PROVIDER_SITE_OTHER): Payer: 59 | Admitting: Family

## 2020-07-14 ENCOUNTER — Other Ambulatory Visit: Payer: Self-pay

## 2020-07-14 ENCOUNTER — Ambulatory Visit: Payer: 59 | Admitting: Dermatology

## 2020-07-14 DIAGNOSIS — J069 Acute upper respiratory infection, unspecified: Secondary | ICD-10-CM | POA: Diagnosis not present

## 2020-07-14 NOTE — Progress Notes (Signed)
Virtual Visit via Video Note  I connected with Katheline Brendlinger on 07/14/20 at 10:40 AM EST by a video enabled telemedicine application and verified that I am speaking with the correct person using two identifiers.  Location: Patient: home Provider: work   I discussed the limitations of evaluation and management by telemedicine and the availability of in person appointments. The patient expressed understanding and agreed to proceed. Only the patient and myself were present for today's video call.   History of Present Illness:  Sunday started feeling sick.  Had nasal congestion, ear fullness. Doing saline rinses.  Helps briefly.  She has had some posterior headaches. Mild cough- "to get the congestion up."   She is breastfeeding her 30 year old twins.  She has not been vaccinated against covid. She had a covid test yesterday which is pending. Both of her kids have ear infections.    Observations/Objective:   Gen: Awake, alert, no acute distress Resp: Breathing is even and non-labored Psych: calm/pleasant demeanor Neuro: Alert and Oriented x 3, + facial symmetry, speech is clear.   Assessment and Plan:  Viral URI- recommended that she continue to quarantine pending result of her covid test.  In addition, recommended that she continue nasal spray 2 sprays each nostril once daily and add mucinex as needed for congestion.  She is to reach out to me if new/worsening symptoms or if she is not improved by Monday. She is advised to call me if her covid testing is +.   Follow Up Instructions:    I discussed the assessment and treatment plan with the patient. The patient was provided an opportunity to ask questions and all were answered. The patient agreed with the plan and demonstrated an understanding of the instructions.   The patient was advised to call back or seek an in-person evaluation if the symptoms worsen or if the condition fails to improve as anticipated.  Nance Pear, NP

## 2020-07-15 ENCOUNTER — Telehealth: Payer: Self-pay

## 2020-07-15 NOTE — Telephone Encounter (Signed)
Pt called following up on previous message. Please advise

## 2020-07-15 NOTE — Telephone Encounter (Signed)
Patient reports a "small red spot on left eye near corner of tear duct". Patient denies pain, irritation or significant drainage. Patient advised it may be due to her allergies and it should get better. She denies exposure to pink eye.  Patient advised if it gets worse or she develops any symptoms to call back, may need another vv due to new problem to be treated properly.

## 2020-07-15 NOTE — Telephone Encounter (Signed)
Pt called stating she saw Melissa virtually yesterday. Pt states she developed a new symptom of eye drainage and redness in left eye. Pt asked if there is anything she needs to do for this. Please advise.

## 2020-07-16 ENCOUNTER — Ambulatory Visit: Payer: 59 | Admitting: Family

## 2020-07-16 NOTE — Telephone Encounter (Signed)
Noted and agree. 

## 2020-07-21 ENCOUNTER — Telehealth: Payer: Self-pay

## 2020-07-21 NOTE — Telephone Encounter (Signed)
This note will be sent to Dr. Tarri Glenn to see when she returns. She is out of the office until Monday. ===View-only below this line===   ----- Message -----      From:Jenna Rhodes      Sent:07/20/2020  5:39 PM EST        PP:[id number] Beavers, MD   Subject:Feeling Naseus, [place]   Hi,  I am feeling nasueas today and my stool is light in color. I am having acid reflux too. Should we look into the health of my gallbladder?   My bloating and fluctuating have improved as I have decreased the amount of vegetables/ fiber that I was consuming. Unfortunately, I feel like this has put more processed type foods in my diet.   Something else that worries me is that about 2 weeks ago I saw something orange floating above the stool that was quarter sized. I have only seen that once.  I have not taken the SIBO test yet. I don't think it is very compatible with breastfeeding. I am down to only 2 nursing sessions a day (morning and night).  I was sick with a virus last week ( started 11/14). My symptoms were only in my sinuses. My covid test was negative. I took a total of 3 mucinex pills during the worst of it. I doubt today's issue is related but I thought I'd see if you agree.  Prior to becoming pregnant I used to take digestive enzymes, prebiotic and multivitamin. I am taking the prenatal vitamin now, I started taking the prebiotic again when I got sick last week. Is there a digestive enzyme you recommend? I haven't taken one because of the babies. How do you think an enzyme would effect my girls via nursing?

## 2020-07-26 ENCOUNTER — Encounter: Payer: Self-pay | Admitting: Family

## 2020-07-26 NOTE — Telephone Encounter (Signed)
Thanks for the information. I think we will be able to address these concerns during her upcoming office visit. Her recent ultrasound was normal. We could proceed with additional gallbladder testing, but, it may be best to discuss this during her upcoming office visit. If she doesn't wish to wait, please schedule a HIDA with [facility].  I am supportive of using probiotics and prebiotics. However, we do not have data to support the use of one over the others. I would also recommend that she discuss recommendations with her OB and/or pediatrician to insure the safety of her girls if she continues to breastfeed.   Thank you.

## 2020-07-27 ENCOUNTER — Telehealth (INDEPENDENT_AMBULATORY_CARE_PROVIDER_SITE_OTHER): Payer: 59 | Admitting: Family Medicine

## 2020-07-27 ENCOUNTER — Encounter: Payer: Self-pay | Admitting: Family Medicine

## 2020-07-27 ENCOUNTER — Other Ambulatory Visit: Payer: Self-pay

## 2020-07-27 DIAGNOSIS — B9689 Other specified bacterial agents as the cause of diseases classified elsewhere: Secondary | ICD-10-CM

## 2020-07-27 DIAGNOSIS — J208 Acute bronchitis due to other specified organisms: Secondary | ICD-10-CM | POA: Diagnosis not present

## 2020-07-27 MED ORDER — AZITHROMYCIN 250 MG PO TABS: ORAL_TABLET | ORAL | 0 refills | Status: DC

## 2020-07-27 MED ORDER — LEVOFLOXACIN 750 MG PO TABS: 750.0000 mg | ORAL_TABLET | Freq: Every day | ORAL | 0 refills | Status: DC

## 2020-07-27 NOTE — Telephone Encounter (Signed)
Left message for patient to call back  

## 2020-07-27 NOTE — Telephone Encounter (Signed)
Patient notified of the recommendations She will keep her appt for 08/02/20 with Dr. Tarri Glenn

## 2020-07-27 NOTE — Progress Notes (Signed)
Chief Complaint  Patient presents with  . Cough    Jenna Rhodes here for URI complaints. Due to COVID-19 pandemic, we are interacting via web portal for an electronic face-to-face visit. I verified patient's ID using 2 identifiers. Patient agreed to proceed with visit via this method. Patient is at home, I am at office. Patient and I are present for visit.   Duration: 16 days Associated symptoms: sinus congestion, ear fullness and cough Denies: sinus pain, rhinorrhea, itchy watery eyes, ear pain, ear drainage, sore throat, wheezing, shortness of breath, myalgia and fevers Treatment to date: Elderberry, apple cider vin/lemon, Mucinex, rinses Sick contacts: Yes- twins had URI, improved with amox. She had rash with both Clinda and amox.  PCP rx'd LQ.   Past Medical History:  Diagnosis Date  . Allergic state 08/17/2016  . Allergy    pt. reported seasonal allergies  . Encounter for screening and preventative care 08/17/2016  . Gastro-esophageal reflux 08/17/2016  . Hyperlipidemia 08/30/2016  . Myalgia 08/17/2016  . Norovirus   . Palpitations 09/21/2016   s/p stopping birth control pills   Exam No conversational dyspnea Age appropriate judgment and insight Nml affect and mood  Acute bacterial bronchitis - Plan: azithromycin (ZITHROMAX) 250 MG tablet  Will hold off on FQ class, will still have her pump/dump with Zpak. INCS for ear fullness.  Continue to push fluids, practice good hand hygiene, cover mouth when coughing. F/u prn. If starting to experience fevers, shaking, or shortness of breath, seek immediate care. Pt voiced understanding and agreement to the plan.  [place], DO 07/27/20 3:05 PM

## 2020-07-27 NOTE — Telephone Encounter (Signed)
Pt is requesting a call back from a nurse (missed call) 

## 2020-07-30 IMAGING — US US EXTREM LOW VENOUS*L*
1 series · 14 of 24 positions shown · non-contrast
Comparison: None.

CLINICAL DATA: Left calf pain.

EXAM:
LEFT LOWER EXTREMITY VENOUS DOPPLER ULTRASOUND
TECHNIQUE: Gray-scale sonography with compression, as well as color and duplex
ultrasound, were performed to evaluate the deep venous system(s)
from the level of the common femoral vein through the popliteal and
proximal calf veins.

[Series 1: us extrem low venous*left* · 0.06mm/px · 14 of 39 slices shown]
[im 1/39]
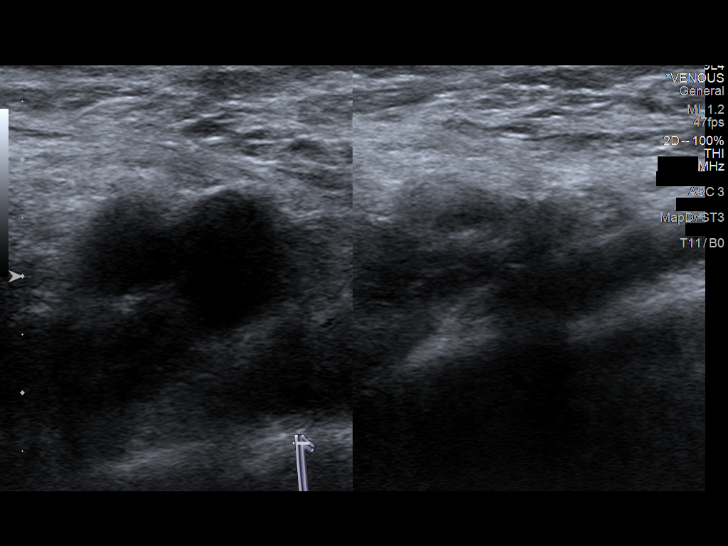
[im 4/39]
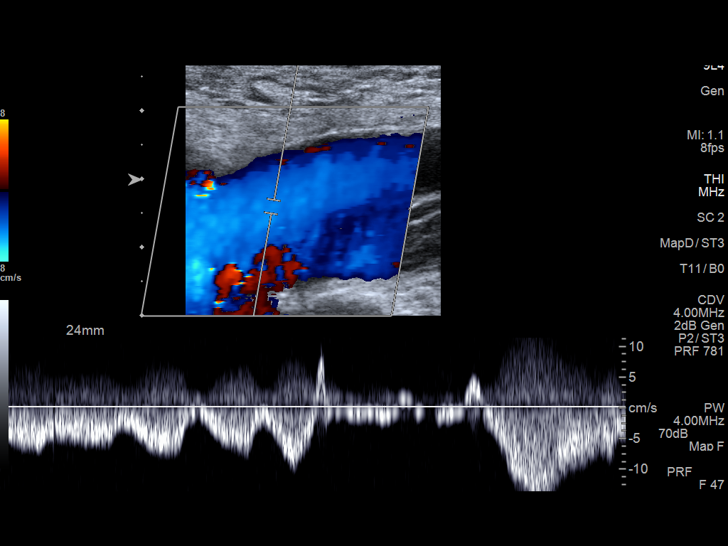
[im 7/39]
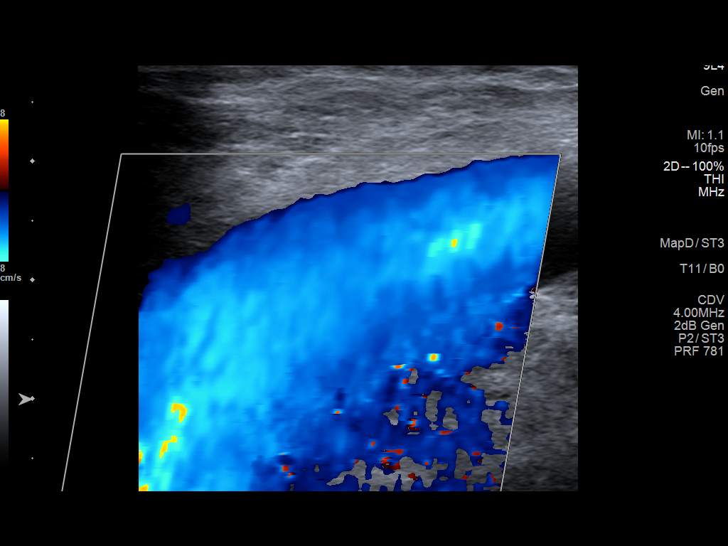
[im 10/39]
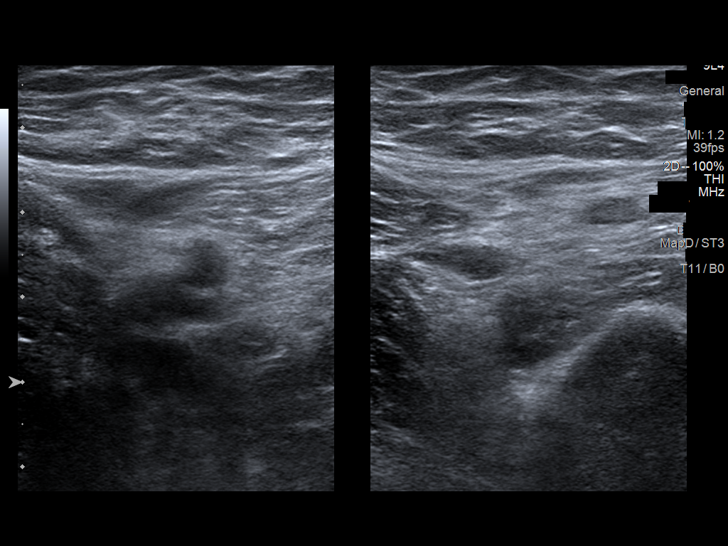
[im 12/39]
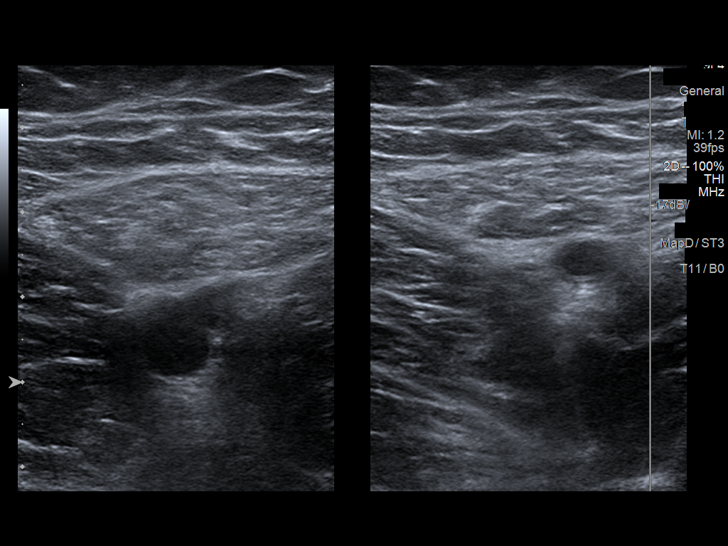
[im 15/39]
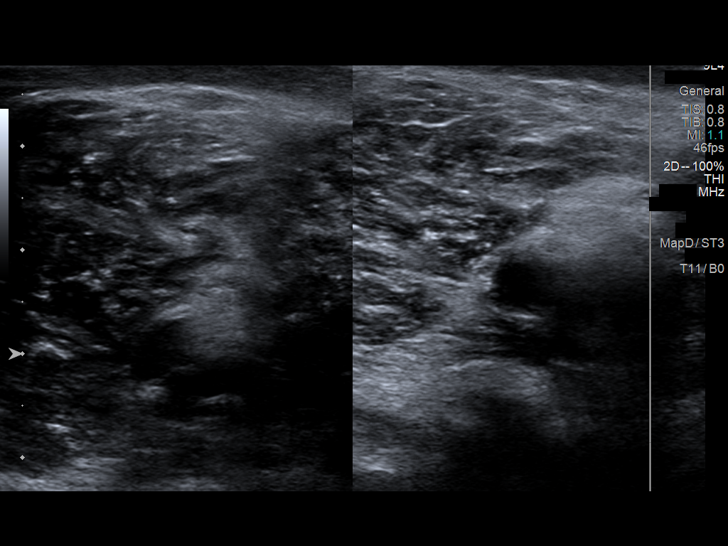
[im 19/39]
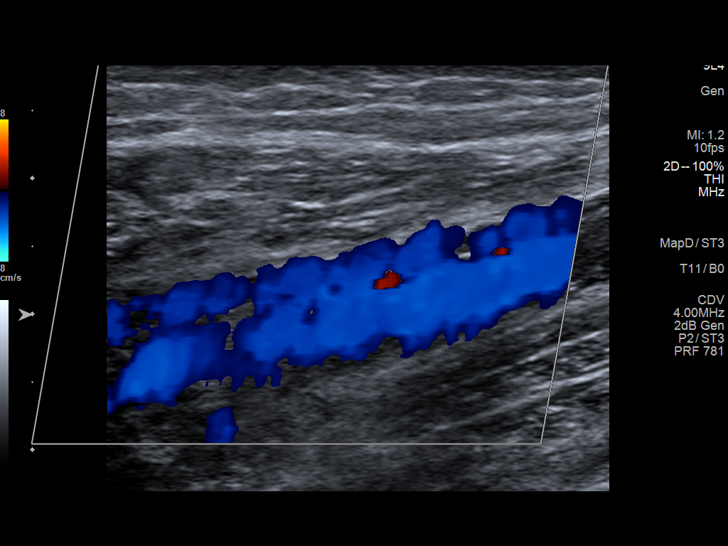
[im 20/39]
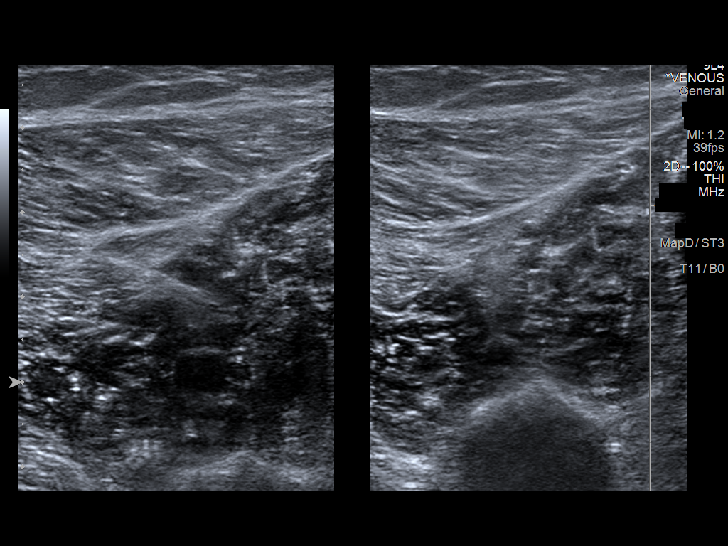
[im 24/39]
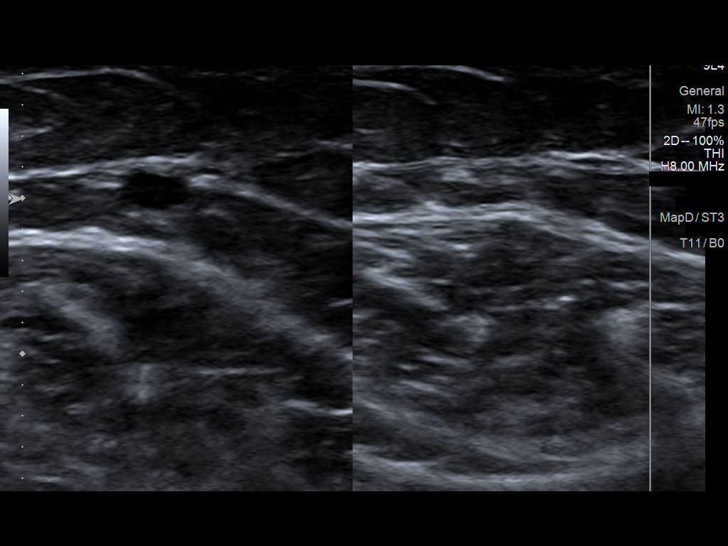
[im 27/39]
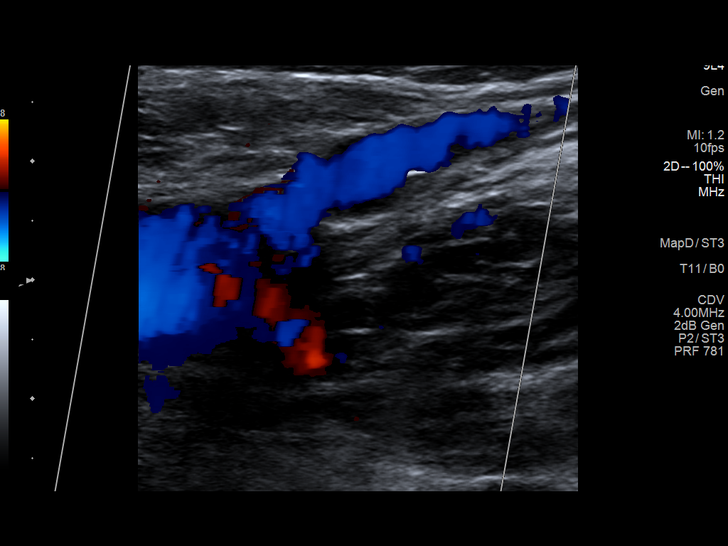
[im 30/39]
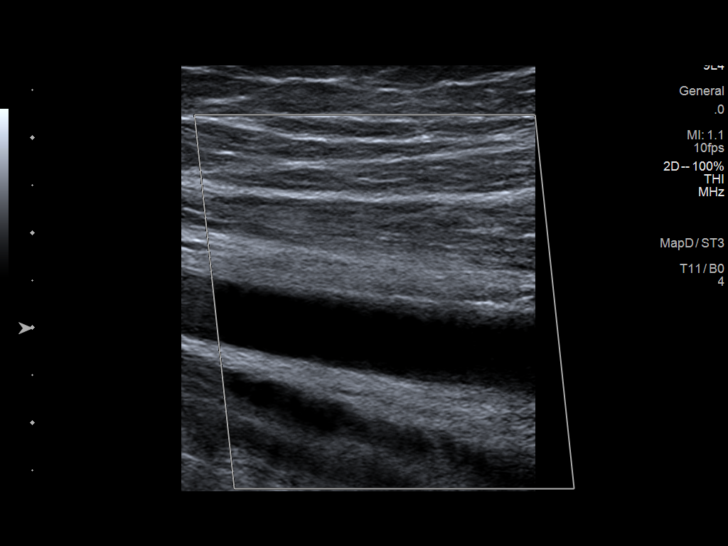
[im 32/39]
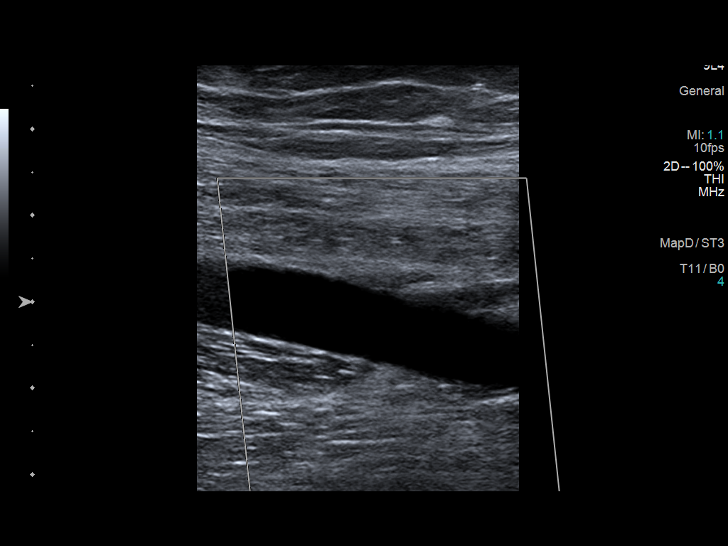
[im 35/39]
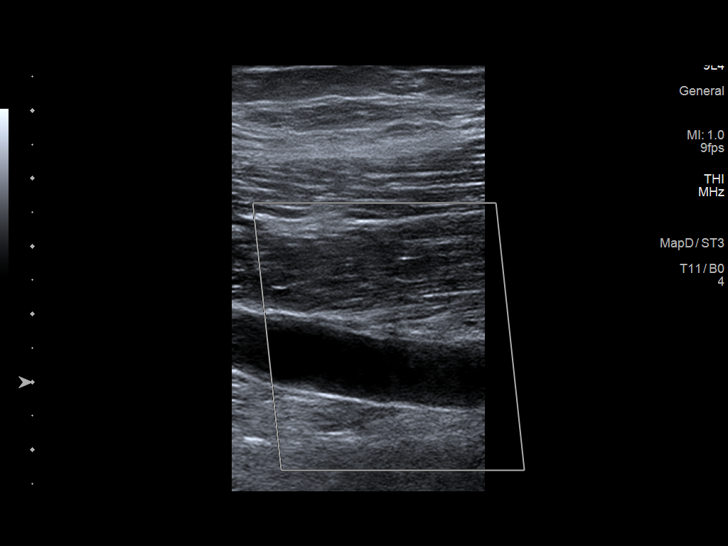
[im 39/39]
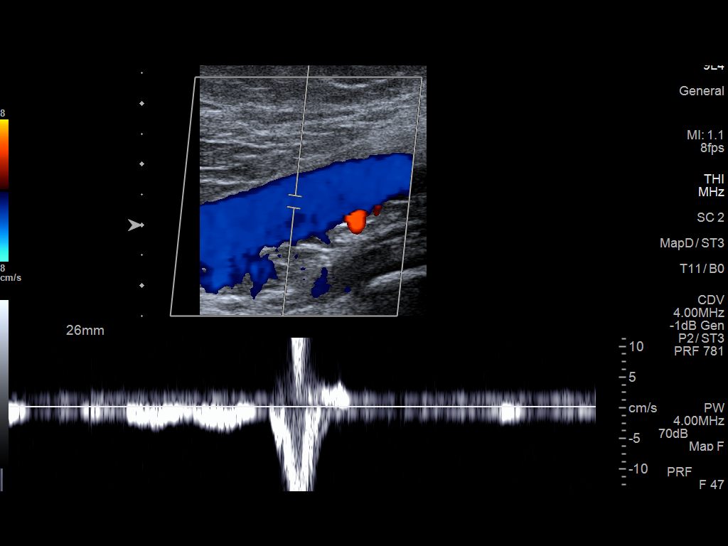

[14 of 24 positions shown; findings below may reference images not displayed]

FINDINGS: VENOUS

Normal compressibility of the common femoral, superficial femoral,
and popliteal veins, as well as the visualized calf veins.
Visualized portions of profunda femoral vein and great saphenous
vein unremarkable. No filling defects to suggest DVT on grayscale or
color Doppler imaging. Doppler waveforms show normal direction of
venous flow, normal respiratory plasticity and response to
augmentation.

Limited views of the contralateral common femoral vein are
unremarkable.

OTHER

None.

Limitations: none
IMPRESSION: Negative for DVT in the left lower extremity.

## 2020-08-02 ENCOUNTER — Ambulatory Visit: Payer: 59 | Admitting: Gastroenterology

## 2020-08-02 ENCOUNTER — Encounter: Payer: Self-pay | Admitting: Gastroenterology

## 2020-08-02 VITALS — BP 104/62 | HR 64 | Ht 72.0 in | Wt 195.0 lb

## 2020-08-02 DIAGNOSIS — R140 Abdominal distension (gaseous): Secondary | ICD-10-CM | POA: Diagnosis not present

## 2020-08-02 DIAGNOSIS — G8929 Other chronic pain: Secondary | ICD-10-CM | POA: Diagnosis not present

## 2020-08-02 DIAGNOSIS — R1011 Right upper quadrant pain: Secondary | ICD-10-CM

## 2020-08-02 NOTE — Patient Instructions (Addendum)
I gave you a hand out on the low FODMAP diet. I am not recommending that you try a true FODMAP diet. But, you may want to try some of the vegetables on this list that are low-fodmap foods.   Let's proceed with the breath test for bacterial overgrowth.  I would also recommend a HIDA scan to test for gallbladder function. You can call at any time to schedule this imaging study.   You may wish to contact your naturopathic doctor to discuss the digestive enzymes that you used in the past.   If you are age 47 or younger, your body mass index should be between 19-25. Your Body mass index is 26.45 kg/m. If this is out of the aformentioned range listed, please consider follow up with your Primary Care Provider.   Please call for a follow-up appointment in 3-4 months.   Thank you for trusting me with your gastrointestinal care!    [place]ton [place], MD, MPH

## 2020-08-02 NOTE — Progress Notes (Signed)
Referring Provider: Debbrah Alar, NP Primary Care Physician:  Debbrah Alar, NP  Reason for Consultation:  Abdominal pain   IMPRESSION:  Abdominal pain, alternating constipation and diarrhea, with gas and bloating  No alarm features. Abdominal ultrasound normal. Celiac serologies negative.  No improvement with gluten free, dairy free, and soy free. Some symptoms improvement avoid vegetables.  PLAN: - Glucose SIBO breath test - Low FODMAP diet handout - Low threshold to pursue additional imaging +/- endoscopy if symptoms persist - Follow-up in 3-4 months, earlier if needed or if symptoms worsen  Please see the "Patient Instructions" section for addition details about the plan.  HPI: Jenna Rhodes is a 29 y.o. female under evaluation of abdominal pain. She was originally seen 11/10/19. The interval history is obtained through the patient and review of her electronic health record.   GERD in her 75s. Treated with 2 weeks of Prilosec. Saw naturopath who treated her with probiotics and digestive enzymes. Continues to take probiotics.  Followed a gluten free diet with minimal dairy prior to her pregnancy on the recommendation of an ENT several years ago.   Developed RUQ or LUQ abdominal pain following delivery of her twins in 2019 with alternating diarrhea and constipation. Associated malodorous stools and bloating.  Feels tired after having a bowel movement. Feels like she's still contracting and has a "scooped out" feeling. No blood or mucous.   Eating gluten free, dairy free, and soy free due to feeding issues without change in her symptoms.   Evaluated by OB. Pelvic ultrasound was negative. Recommended GI evaluation given symptom location.  Her twins are now 19 months ago.  Continues to have ongoing RUQ pain with associated bloating and diarrhea. Felt she looked distended.  Since then she stopped eating veggies and has felt better.  She finds that onions might  be the biggest culprit.   Recent evaluation:  Abdominal ultrasound 05/18/20 was normal.  TTGA and IgA were normal Labs 04/06/20: normal CMP except for alk phos 142  Past Medical History:  Diagnosis Date  . Allergic state 08/17/2016  . Allergy    pt. reported seasonal allergies  . Encounter for screening and preventative care 08/17/2016  . Gastro-esophageal reflux 08/17/2016  . Hyperlipidemia 08/30/2016  . Myalgia 08/17/2016  . Norovirus   . Palpitations 09/21/2016   s/p stopping birth control pills    Past Surgical History:  Procedure Laterality Date  . MANDIBLE SURGERY  08/28/2002  . WISDOM TOOTH EXTRACTION      Current Outpatient Medications  Medication Sig Dispense Refill  . fluticasone (FLONASE) 50 MCG/ACT nasal spray Place 2 sprays into both nostrils daily.    . Probiotic Product (PROBIOTIC PO) Take 2 capsules by mouth daily.     No current facility-administered medications for this visit.    Allergies as of 08/02/2020 - Review Complete 08/02/2020  Allergen Reaction Noted  . Amoxicillin Rash 08/16/2016  . Clindamycin/lincomycin Rash 08/17/2016  . Mobic [meloxicam] Rash 08/17/2016    Family History  Problem Relation Age of Onset  . Lung cancer Mother   . Parkinson's disease Father   . Diabetes Maternal Grandmother   . Hypertension Maternal Grandmother   . Breast cancer Maternal Grandmother   . Stomach cancer Paternal Grandmother   . Lymphoma Paternal Grandmother   . Neuropathy Paternal Grandfather   . Hearing loss Paternal Grandfather        s/p bypass  . Prostate cancer Paternal Grandfather     Social History   Socioeconomic  History  . Marital status: Married    Spouse name: Not on file  . Number of children: 2  . Years of education: Not on file  . Highest education level: Not on file  Occupational History  . Occupation: stay at home mom  Tobacco Use  . Smoking status: Never Smoker  . Smokeless tobacco: Never Used  Vaping Use  . Vaping Use:  Never used  Substance and Sexual Activity  . Alcohol use: Not Currently    Alcohol/week: 2.0 standard drinks    Types: 2 Glasses of wine per week  . Drug use: No  . Sexual activity: Yes    Partners: Male    Birth control/protection: Pill  Other Topics Concern  . 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 Difficulty of Paying Living Expenses: Not on file  Food Insecurity:   . Worried About Charity fundraiser in the Last Year: Not on file  . Ran Out of Food in the Last Year: Not on file  Transportation Needs:   . Lack of Transportation (Medical): Not on file  . Lack of Transportation (Non-Medical): Not on file  Physical Activity:   . Days of Exercise per Week: Not on file  . Minutes of Exercise per Session: Not on file  Stress:   . Feeling of Stress : Not on file  Social Connections:   . Frequency of Communication with Friends and Family: Not on file  . Frequency of Social Gatherings with Friends and Family: Not on file  . Attends Religious Services: Not on file  . Active Member of Clubs or Organizations: Not on file  . Attends Archivist Meetings: Not on file  . Marital Status: Not on file  Intimate Partner Violence:   . Fear of Current or Ex-Partner: Not on file  . Emotionally Abused: Not on file  . Physically Abused: Not on file  . Sexually Abused: Not on file   Physical Exam: General:   Alert,  well-nourished, pleasant and cooperative in NAD Head:  Normocephalic and atraumatic. Eyes:  Sclera clear, no icterus.   Conjunctiva pink. Lungs:  Clear throughout to auscultation.   No wheezes. Heart:  Regular rate and rhythm; no murmurs. Abdomen:  Soft, nontender, nondistended, normal bowel sounds, no rebound or guarding. No hepatosplenomegaly.   Neurologic:  Alert and  oriented x4;  grossly  nonfocal Skin:  Intact without significant lesions or rashes. Psych:  Alert and cooperative. Normal mood and affect.     Pruitt Taboada L. Tarri Glenn, MD, MPH 08/02/2020, 3:25 PM

## 2020-08-28 NOTE — L&D Delivery Note (Signed)
Delivery Note   Jenna Rhodes, Jenna Rhodes [id number]  Pt pushed twice with great descent noted. At 5:02 AM a viable female was delivered via Vaginal, Spontaneous (Presentation: ;  LOA).  APGAR: 9, 9; weight pending .  Anterior and posterior shoulders delivered with the next push and body easily followed.  Coird was clamped after a minute delay and then FOB allowed to cut it.   Infant was handed off on mothers abdomen Cord blood was obtained.       Jenna Rhodes, Jenna Rhodes  Vertex was palpated through the amniotic sac.  The amniotic sac was ruptured with copious clear fluid noted. A contraction occurred and vertex palpated to descend spontaneously from -2 to +2 station.   Pt then pushed once and at 5:09 AM a viable female was delivered via Vaginal, Spontaneous (Presentation:OP ;  ).  APGAR:9 ,9 ; weight pending   Nuchal x 1 was reduced over the vertex at the perineum .Anterior and posterior shoulders then spontaneously delivered with body easily following.   Infant handed off on mothers abdomen as well. Cord was then clamped and FOB allowed to cut again after a minute delay.  Cord blood obtained  The placenta then delivered after a few minutes. Both were noted to be fused together. Cord for B was marked with a clamp.   Anesthesia:  Epidural Episiotomy: None Lacerations:  second decree  Suture Repair: 3.0 vicryl Est. Blood Loss (mL): 361   Mom to postpartum.   Baby A to Couplet care / Skin to Skin.   Baby B to Couplet care / Skin to Skin.  They desire circumcisions for babies   Isaiah Serge 05/02/2021, 5:50 AM

## 2020-11-18 IMAGING — US US ABDOMEN COMPLETE
1 series · 14 of 25 positions shown · non-contrast
Comparison: None.

CLINICAL DATA: Generalized abdominal pain

EXAM:
ABDOMEN ULTRASOUND COMPLETE

[Series 1: us abdomen complete · 14 of 62 slices shown]
[im 1/62]
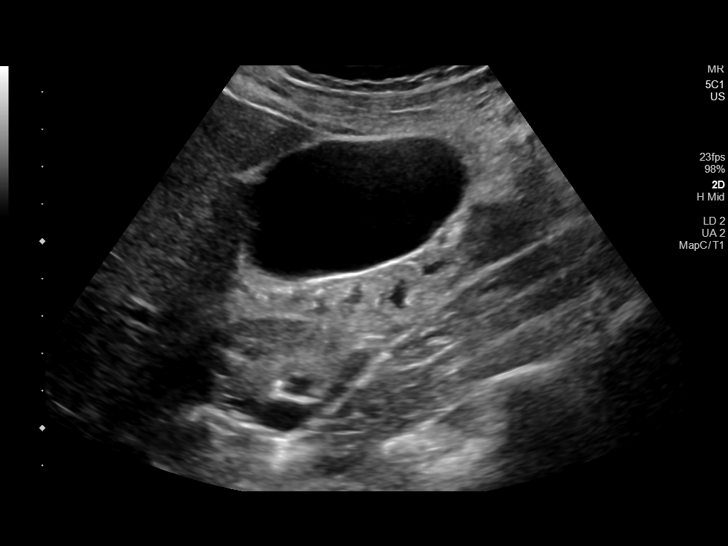
[im 6/62]
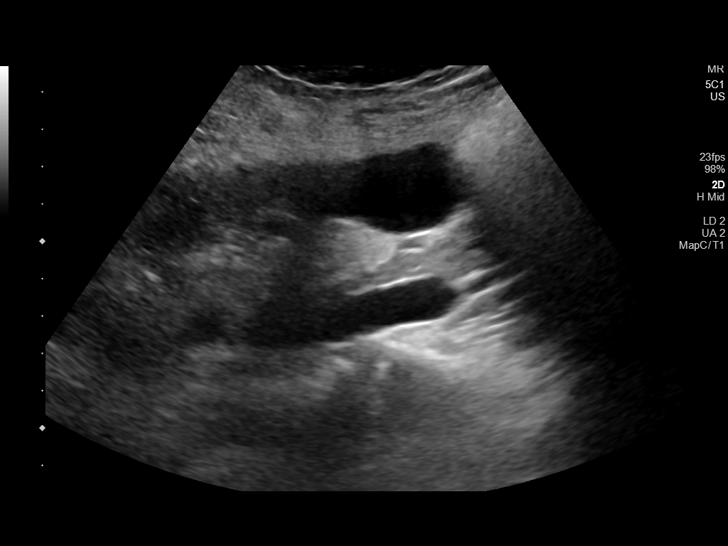
[im 11/62]
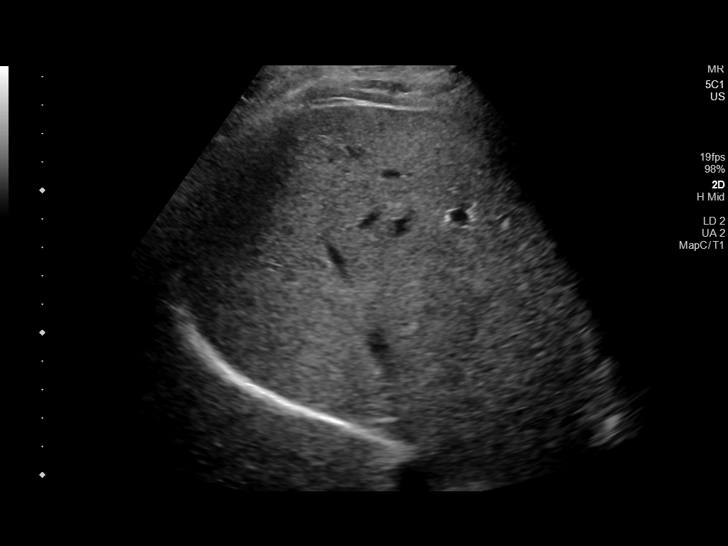
[im 16/62]
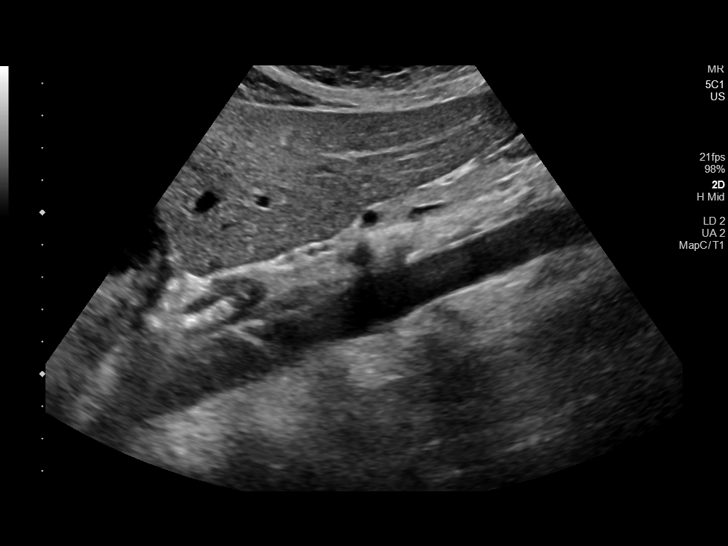
[im 21/62]
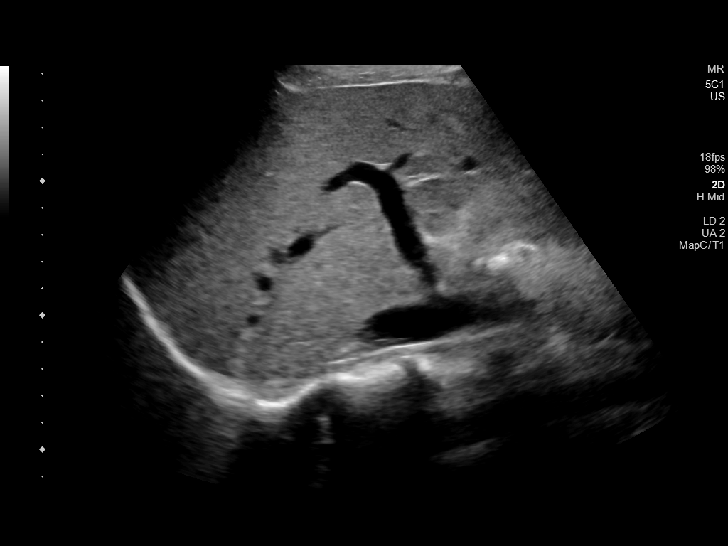
[im 23/62]
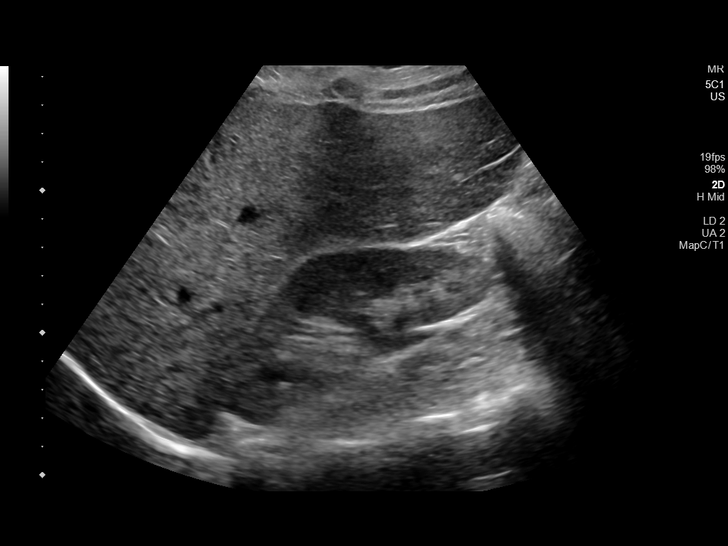
[im 28/62]
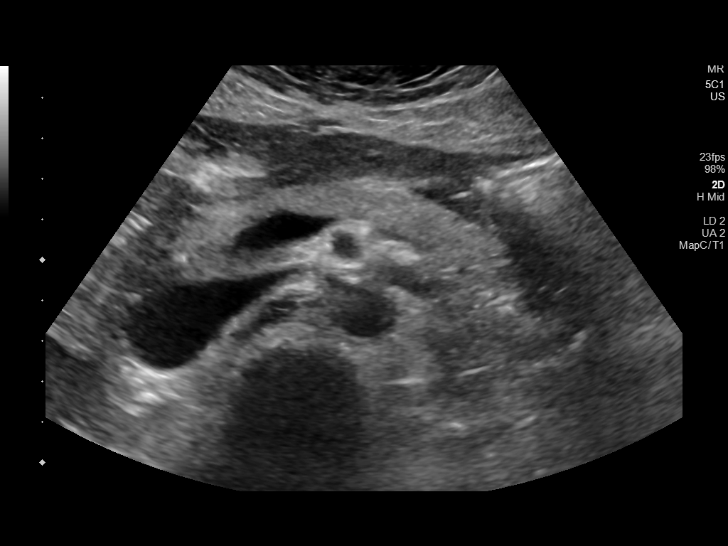
[im 34/62]
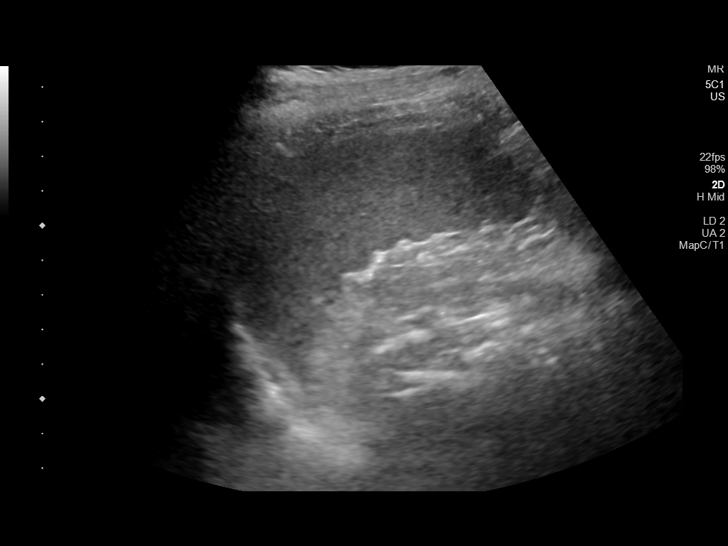
[im 39/62]
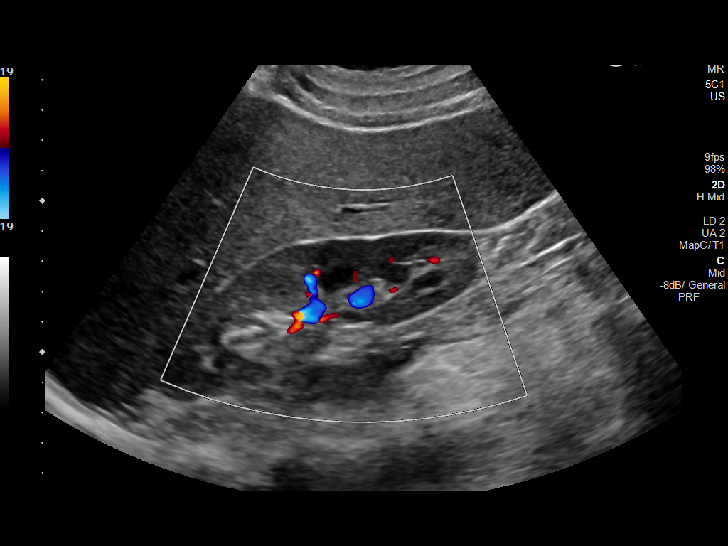
[im 41/62]
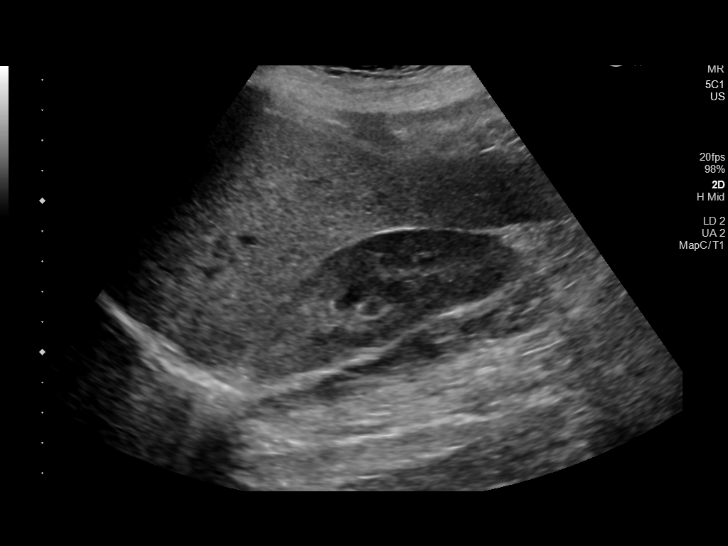
[im 46/62]
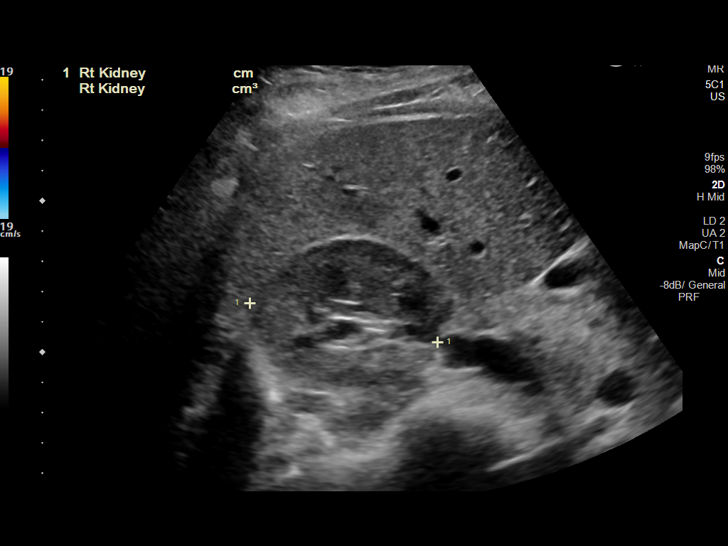
[im 51/62]
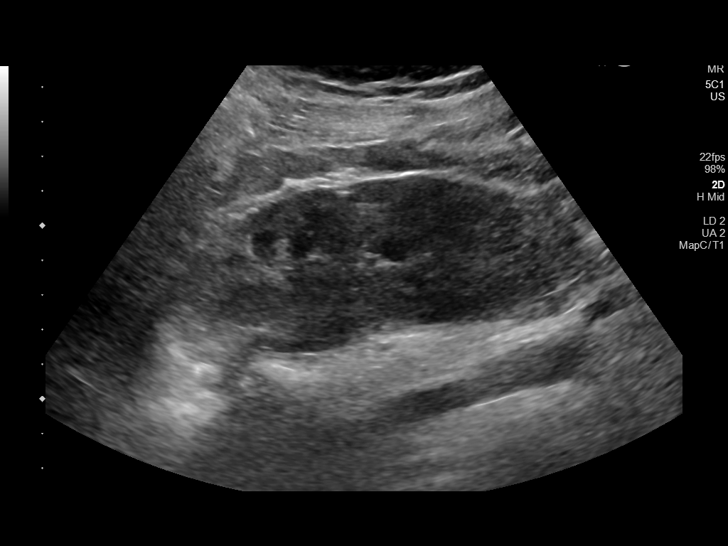
[im 56/62]
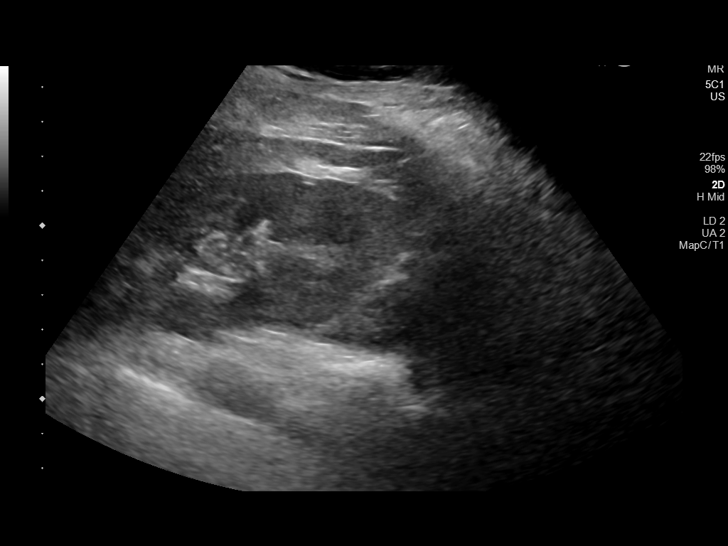
[im 62/62]
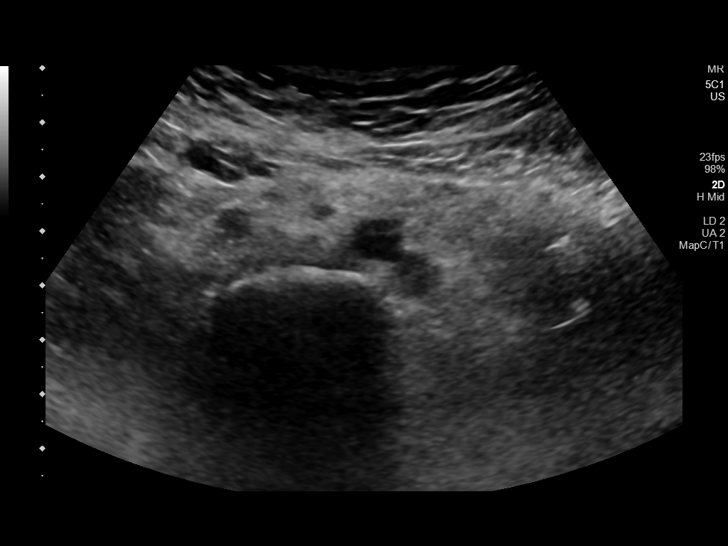

[14 of 25 positions shown; findings below may reference images not displayed]

FINDINGS: Gallbladder: No gallstones or wall thickening visualized. No
sonographic Murphy sign noted by sonographer.

Common bile duct: Diameter: 3.4 mm

Liver: No focal lesion identified. Within normal limits in
parenchymal echogenicity. Portal vein is patent on color Doppler
imaging with normal direction of blood flow towards the liver.

IVC: No abnormality visualized.

Pancreas: Visualized portion unremarkable.

Spleen: Size and appearance within normal limits.

Right Kidney: Length: 11.6 cm. Echogenicity within normal limits. No
mass or hydronephrosis visualized.

Left Kidney: Length: 12.9 cm. Echogenicity within normal limits. No
mass or hydronephrosis visualized.

Abdominal aorta: No aneurysm visualized.

Other findings: None.
IMPRESSION: Normal abdominal ultrasound

## 2020-12-08 ENCOUNTER — Ambulatory Visit: Payer: 59 | Admitting: Dermatology

## 2021-04-18 ENCOUNTER — Inpatient Hospital Stay (HOSPITAL_BASED_OUTPATIENT_CLINIC_OR_DEPARTMENT_OTHER): Payer: 59

## 2021-04-18 ENCOUNTER — Encounter (HOSPITAL_COMMUNITY): Payer: Self-pay | Admitting: Obstetrics and Gynecology

## 2021-04-18 ENCOUNTER — Other Ambulatory Visit: Payer: Self-pay

## 2021-04-18 ENCOUNTER — Inpatient Hospital Stay (HOSPITAL_COMMUNITY)
Admission: AD | Admit: 2021-04-18 | Discharge: 2021-04-18 | Disposition: A | Discharge status: Home / Self Care | Payer: 59 | Attending: Obstetrics and Gynecology | Admitting: Obstetrics and Gynecology

## 2021-04-18 DIAGNOSIS — Z888 Allergy status to other drugs, medicaments and biological substances status: Secondary | ICD-10-CM | POA: Insufficient documentation

## 2021-04-18 DIAGNOSIS — Z880 Allergy status to penicillin: Secondary | ICD-10-CM | POA: Diagnosis not present

## 2021-04-18 DIAGNOSIS — O4693 Antepartum hemorrhage, unspecified, third trimester: Secondary | ICD-10-CM

## 2021-04-18 DIAGNOSIS — O30033 Twin pregnancy, monochorionic/diamniotic, third trimester: Secondary | ICD-10-CM | POA: Diagnosis not present

## 2021-04-18 DIAGNOSIS — Z3A33 33 weeks gestation of pregnancy: Secondary | ICD-10-CM | POA: Insufficient documentation

## 2021-04-18 DIAGNOSIS — Z881 Allergy status to other antibiotic agents status: Secondary | ICD-10-CM | POA: Insufficient documentation

## 2021-04-18 DIAGNOSIS — O30043 Twin pregnancy, dichorionic/diamniotic, third trimester: Secondary | ICD-10-CM | POA: Diagnosis not present

## 2021-04-18 DIAGNOSIS — O479 False labor, unspecified: Secondary | ICD-10-CM

## 2021-04-18 DIAGNOSIS — O4703 False labor before 37 completed weeks of gestation, third trimester: Secondary | ICD-10-CM

## 2021-04-18 LAB — URINALYSIS, ROUTINE W REFLEX MICROSCOPIC
Bilirubin Urine: NEGATIVE
Glucose, UA: NEGATIVE mg/dL
Hgb urine dipstick: NEGATIVE
Ketones, ur: NEGATIVE mg/dL
Leukocytes,Ua: NEGATIVE
Nitrite: NEGATIVE
Protein, ur: NEGATIVE mg/dL
Specific Gravity, Urine: 1.004 — ABNORMAL LOW (ref 1.005–1.030)
pH: 7 (ref 5.0–8.0)

## 2021-04-18 NOTE — MAU Provider Note (Addendum)
History     CSN: [id number]  Arrival date and time: 04/18/21 2003  Event Date/Time  First Provider Initiated Contact with Patient 04/18/21 2102      Chief Complaint  Patient presents with   Contractions   Vaginal Bleeding   HPI Jenna Rhodes is a 30 y.o. G2P0102 at 30w6dwho presents to MAU with chief complaint of vaginal bleeding and preterm contractions. These are new complaints. Patient's pain began around 1600 hours today. She estimates that her contractions have been occurring every 5-8 min since onset.  Patient's vaginal bleeding began today at 1900 hours. She endorses seeing a large volume of bright red blood when she sat on the commode. She states her vaginal bleeding has gradually decreased since then.   [facility]  OB History     Gravida  2   Para  1   Term  0   Preterm  1   AB  0   Living  2      SAB  0   IAB  0   Ectopic  0   Multiple  1   Live Births  2           Past Medical History:  Diagnosis Date   Allergic state 08/17/2016   Allergy    pt. reported seasonal allergies   Encounter for screening and preventative care 08/17/2016   Gastro-esophageal reflux 08/17/2016   Hyperlipidemia 08/30/2016   Myalgia 08/17/2016   Norovirus    Palpitations 09/21/2016   s/p stopping birth control pills    Past Surgical History:  Procedure Laterality Date   MANDIBLE SURGERY  08/28/2002   WISDOM TOOTH EXTRACTION      Family History  Problem Relation Age of Onset   Lung cancer Mother    Parkinson's disease Father    Diabetes Maternal Grandmother    Hypertension Maternal Grandmother    Breast cancer Maternal Grandmother    Stomach cancer Paternal Grandmother    Lymphoma Paternal Grandmother    Neuropathy Paternal Grandfather    Hearing loss Paternal Grandfather        s/p bypass   Prostate cancer Paternal Grandfather     Social History   Tobacco Use   Smoking status: Never   Smokeless tobacco:  Never  Vaping Use   Vaping Use: Never used  Substance Use Topics   Alcohol use: Not Currently    Alcohol/week: 2.0 standard drinks    Types: 2 Glasses of wine per week   Drug use: No    Allergies:  Allergies  Allergen Reactions   Amoxicillin Rash    Did it involve swelling of the face/tongue/throat, SOB, or low BP? No Did it involve sudden or severe rash/hives, skin peeling, or any reaction on the inside of your mouth or nose? Yes Did you need to seek medical attention at a hospital or doctor's office? No When did it last happen?      within last 10 years If all above answers are "NO", may proceed with cephalosporin use.    Clindamycin/Lincomycin Rash   Mobic [Meloxicam] Rash    Medications Prior to Admission  Medication Sig Dispense Refill Last Dose   Prenatal Vit-Fe Fumarate-FA (MULTIVITAMIN-PRENATAL) 27-0.8 MG TABS tablet Take 1 tablet by mouth daily at 12 noon.      fluticasone (FLONASE) 50 MCG/ACT nasal spray Place 2 sprays into both nostrils daily.      Probiotic Product (PROBIOTIC PO) Take 2 capsules by mouth daily.  Review of Systems  Gastrointestinal:  Positive for abdominal pain.  Genitourinary:  Positive for vaginal bleeding.  All other systems reviewed and are negative. Physical Exam   Blood pressure 126/70, pulse 94, temperature 98.8 F (37.1 C), temperature source Oral, resp. rate 18, height '6\' 1"'$  (1.854 m), weight 110.1 kg, SpO2 95 %, currently breastfeeding.  Physical Exam Vitals and nursing note reviewed. Exam conducted with a chaperone present.  Constitutional:      General: She is not in acute distress.    Appearance: Normal appearance. She is not ill-appearing.  Cardiovascular:     Rate and Rhythm: Normal rate.     Pulses: Normal pulses.     Heart sounds: Normal heart sounds.  Pulmonary:     Effort: Pulmonary effort is normal.     Breath sounds: Normal breath sounds.  Abdominal:     Comments: Gravid  Skin:    Capillary Refill: Capillary  refill takes less than 2 seconds.  Neurological:     Mental Status: She is alert and oriented to person, place, and time.  Psychiatric:        Mood and Affect: Mood normal.        Behavior: Behavior normal.        Thought Content: Thought content normal.        Judgment: Judgment normal.   Fetal Tracing: Baby A Baseline: 150 Variability: moderate Accelerations: 15x15 Decelerations: none  Baby B Baseline: 140 Variability: moderate Accelerations: 15x15 Decelerations: none  Toco: no regular contractions   MAU Course  Procedures  --FFN not collected due to report of recent vaginal bleeding, concern for false positive FFN.  --Cervix closed/thick/posterior on arrival to MAU. No blood noted on exam glove --Discussed toco activity, indication for treatment with tocolytics and IV fluid bolus. Pt declines, reports that she will reconsider medication once she has ultrasound accomplished  Orders Placed This Encounter  Procedures   [place] MFM OB Limited   Urinalysis, Routine w reflex microscopic Urine, Clean Catch   Report given to E. Purcell Nails, NP who assumes care of patient at this time  Mallie Snooks, MSN, CNM Certified Nurse Midwife, [facility] for [employer], [place] 04/18/21 9:51 PM   Normal limited OB ultrasound Patient reports improvement of symptoms after oral hydration and declines repeat cervical exam Assessment and Plan   1. Braxton Hicks contractions   2. Dichorionic diamniotic twin pregnancy in third trimester   3. [redacted] weeks gestation of pregnancy    -reviewed PTL precautions & reasons to return to MAU -keep f/u with OB  Jorje Guild, NP

## 2021-04-18 NOTE — MAU Note (Signed)
..  Jenna Rhodes is a 30 y.o. at 53w6dhere in MAU reporting: contractions since 3:50pm that are now every 5 minutes. Vaginal bleeding that she saw when she was on the toilet since 7:10pm, reports less bleeding when she went to restroom here. Reports decreased fetal movement but say she has not really been paying attention to it.  Denies headache, epigastric pain, or visual changes.  Pain score: 4/10 Vitals:   04/18/21 2023 04/18/21 2025  BP: (!) 132/91   Pulse: (!) 120   Resp:  17  Temp:  98.8 F (37.1 C)  SpO2:  100%     FHT:  A: 143 B: 151   Lab orders placed from triage: UA

## 2021-05-01 ENCOUNTER — Inpatient Hospital Stay (HOSPITAL_COMMUNITY): Payer: 59 | Admitting: Anesthesiology

## 2021-05-01 ENCOUNTER — Inpatient Hospital Stay (HOSPITAL_COMMUNITY)
Admission: AD | Admit: 2021-05-01 | Discharge: 2021-05-04 | DRG: 805 | Disposition: A | Discharge status: Home / Self Care | Payer: 59 | Attending: Obstetrics and Gynecology | Admitting: Obstetrics and Gynecology

## 2021-05-01 ENCOUNTER — Other Ambulatory Visit: Payer: Self-pay

## 2021-05-01 ENCOUNTER — Encounter (HOSPITAL_COMMUNITY): Payer: Self-pay | Admitting: Obstetrics and Gynecology

## 2021-05-01 DIAGNOSIS — O42913 Preterm premature rupture of membranes, unspecified as to length of time between rupture and onset of labor, third trimester: Secondary | ICD-10-CM | POA: Diagnosis present

## 2021-05-01 DIAGNOSIS — Z880 Allergy status to penicillin: Secondary | ICD-10-CM

## 2021-05-01 DIAGNOSIS — O30043 Twin pregnancy, dichorionic/diamniotic, third trimester: Secondary | ICD-10-CM | POA: Diagnosis present

## 2021-05-01 DIAGNOSIS — O4290 Premature rupture of membranes, unspecified as to length of time between rupture and onset of labor, unspecified weeks of gestation: Secondary | ICD-10-CM | POA: Diagnosis present

## 2021-05-01 DIAGNOSIS — O2243 Hemorrhoids in pregnancy, third trimester: Secondary | ICD-10-CM | POA: Diagnosis present

## 2021-05-01 DIAGNOSIS — Z3A35 35 weeks gestation of pregnancy: Secondary | ICD-10-CM | POA: Diagnosis not present

## 2021-05-01 LAB — COMPREHENSIVE METABOLIC PANEL
ALT: 16 U/L (ref 0–44)
AST: 28 U/L (ref 15–41)
Albumin: 2.5 g/dL — ABNORMAL LOW (ref 3.5–5.0)
Alkaline Phosphatase: 157 U/L — ABNORMAL HIGH (ref 38–126)
Anion gap: 8 (ref 5–15)
BUN: 10 mg/dL (ref 6–20)
CO2: 19 mmol/L — ABNORMAL LOW (ref 22–32)
Calcium: 8.9 mg/dL (ref 8.9–10.3)
Chloride: 107 mmol/L (ref 98–111)
Creatinine, Ser: 0.64 mg/dL (ref 0.44–1.00)
GFR, Estimated: >60 mL/min (ref >60)
Glucose, Bld: 78 mg/dL (ref 70–99)
Potassium: 3.8 mmol/L (ref 3.5–5.1)
Sodium: 134 mmol/L — ABNORMAL LOW (ref 135–145)
Total Bilirubin: 0.3 mg/dL (ref 0.3–1.2)
Total Protein: 6 g/dL — ABNORMAL LOW (ref 6.5–8.1)

## 2021-05-01 LAB — CBC
HCT: 38 % (ref 36.0–46.0)
Hemoglobin: 12.6 g/dL (ref 12.0–15.0)
MCH: 30.4 pg (ref 26.0–34.0)
MCHC: 33.2 g/dL (ref 30.0–36.0)
MCV: 91.6 fL (ref 80.0–100.0)
Platelets: 211 10*3/uL (ref 150–400)
RBC: 4.15 MIL/uL (ref 3.87–5.11)
RDW: 13.5 % (ref 11.5–15.5)
WBC: 14.1 10*3/uL — ABNORMAL HIGH (ref 4.0–10.5)
nRBC: 0 % (ref 0.0–0.2)

## 2021-05-01 LAB — TYPE AND SCREEN
ABO/RH(D): O POS
Antibody Screen: NEGATIVE

## 2021-05-01 LAB — POCT FERN TEST: POCT Fern Test: POSITIVE

## 2021-05-01 MED ORDER — LIDOCAINE HCL (PF) 1 % IJ SOLN
INTRAMUSCULAR | Status: DC | PRN
  Administered 2021-05-01: 8 mL via EPIDURAL

## 2021-05-01 MED ORDER — TERBUTALINE SULFATE 1 MG/ML IJ SOLN: 0.2500 mg | Freq: Once | INTRAMUSCULAR | Status: DC | PRN

## 2021-05-01 MED ORDER — OXYCODONE-ACETAMINOPHEN 5-325 MG PO TABS: 1.0000 | ORAL_TABLET | ORAL | Status: DC | PRN

## 2021-05-01 MED ORDER — FENTANYL-BUPIVACAINE-NACL 0.5-0.125-0.9 MG/250ML-% EP SOLN
12.0000 mL/h | EPIDURAL | Status: DC | PRN
  Administered 2021-05-01: 12 mL/h via EPIDURAL
  Filled 2021-05-01: qty 250

## 2021-05-01 MED ORDER — PHENYLEPHRINE 40 MCG/ML (10ML) SYRINGE FOR IV PUSH (FOR BLOOD PRESSURE SUPPORT): 80.0000 ug | PREFILLED_SYRINGE | INTRAVENOUS | Status: DC | PRN

## 2021-05-01 MED ORDER — ONDANSETRON HCL 4 MG/2ML IJ SOLN: 4.0000 mg | Freq: Four times a day (QID) | INTRAMUSCULAR | Status: DC | PRN

## 2021-05-01 MED ORDER — OXYTOCIN-SODIUM CHLORIDE 30-0.9 UT/500ML-% IV SOLN
1.0000 m[IU]/min | INTRAVENOUS | Status: DC
  Administered 2021-05-02: 2 m[IU]/min via INTRAVENOUS
  Filled 2021-05-01: qty 500

## 2021-05-01 MED ORDER — OXYCODONE-ACETAMINOPHEN 5-325 MG PO TABS: 2.0000 | ORAL_TABLET | ORAL | Status: DC | PRN

## 2021-05-01 MED ORDER — EPHEDRINE 5 MG/ML INJ: 10.0000 mg | INTRAVENOUS | Status: DC | PRN

## 2021-05-01 MED ORDER — PHENYLEPHRINE 40 MCG/ML (10ML) SYRINGE FOR IV PUSH (FOR BLOOD PRESSURE SUPPORT)
80.0000 ug | PREFILLED_SYRINGE | INTRAVENOUS | Status: DC | PRN
  Filled 2021-05-01: qty 10

## 2021-05-01 MED ORDER — LACTATED RINGERS IV SOLN: 500.0000 mL | Freq: Once | INTRAVENOUS | Status: DC

## 2021-05-01 MED ORDER — LIDOCAINE HCL (PF) 1 % IJ SOLN: 30.0000 mL | INTRAMUSCULAR | Status: DC | PRN

## 2021-05-01 MED ORDER — OXYTOCIN-SODIUM CHLORIDE 30-0.9 UT/500ML-% IV SOLN: 2.5000 [IU]/h | INTRAVENOUS | Status: DC

## 2021-05-01 MED ORDER — LACTATED RINGERS IV SOLN: INTRAVENOUS | Status: DC

## 2021-05-01 MED ORDER — ACETAMINOPHEN 325 MG PO TABS: 650.0000 mg | ORAL_TABLET | ORAL | Status: DC | PRN

## 2021-05-01 MED ORDER — SOD CITRATE-CITRIC ACID 500-334 MG/5ML PO SOLN: 30.0000 mL | ORAL | Status: DC | PRN

## 2021-05-01 MED ORDER — DIPHENHYDRAMINE HCL 50 MG/ML IJ SOLN: 12.5000 mg | INTRAMUSCULAR | Status: DC | PRN

## 2021-05-01 MED ORDER — OXYTOCIN BOLUS FROM INFUSION
333.0000 mL | Freq: Once | INTRAVENOUS | Status: AC
  Administered 2021-05-02: 333 mL via INTRAVENOUS

## 2021-05-01 MED ORDER — LACTATED RINGERS IV SOLN: 500.0000 mL | INTRAVENOUS | Status: DC | PRN

## 2021-05-01 NOTE — MAU Note (Addendum)
Patient arrived to MAU c/o leaking clear fluid @ 1445. And contractions that started at1515. Every 2-3 mins. Patient reports no vaginal bleeding. + FM. Efm commenced.

## 2021-05-01 NOTE — Anesthesia Preprocedure Evaluation (Signed)
Anesthesia Evaluation  Patient identified by MRN, date of birth, ID band Patient awake    Reviewed: Allergy & Precautions, NPO status , Patient's Chart, lab work & pertinent test results  Airway Mallampati: II  TM Distance: >3 FB Neck ROM: Full    Dental no notable dental hx.    Pulmonary neg pulmonary ROS,    Pulmonary exam normal breath sounds clear to auscultation       Cardiovascular negative cardio ROS Normal cardiovascular exam Rhythm:Regular Rate:Normal     Neuro/Psych negative neurological ROS  negative psych ROS   GI/Hepatic Neg liver ROS, GERD  ,  Endo/Other  negative endocrine ROS  Renal/GU negative Renal ROS  negative genitourinary   Musculoskeletal negative musculoskeletal ROS (+)   Abdominal   Peds negative pediatric ROS (+)  Hematology negative hematology ROS (+)   Anesthesia Other Findings   Reproductive/Obstetrics (+) Pregnancy                             Anesthesia Physical Anesthesia Plan  ASA: 2  Anesthesia Plan: Epidural   Post-op Pain Management:    Induction:   PONV Risk Score and Plan: 3 and Treatment may vary due to age or medical condition  Airway Management Planned: Natural Airway  Additional Equipment:   Intra-op Plan:   Post-operative Plan:   Informed Consent: I have reviewed the patients History and Physical, chart, labs and discussed the procedure including the risks, benefits and alternatives for the proposed anesthesia with the patient or authorized representative who has indicated his/her understanding and acceptance.       Plan Discussed with: Anesthesiologist  Anesthesia Plan Comments:         Anesthesia Quick Evaluation

## 2021-05-01 NOTE — H&P (Addendum)
Jenna Rhodes is a 30 y.o. G87P0102 female presenting at 32 5/[redacted]wks gestation of di-di twins with PPROM.  Pt reports LOF at 245pm today. Had to wait for childcare for her older twins before could come in. She is dated per LMP which was confirmed via a 9week [place].  Her pregnancy has been complicated by pelvic pain - saw PT ; suspected covid infection a month ago, and history of preterm delivery of twins - declined progesterone.She recently had bleeding from external hemorrhoids.  She never did glucola screening; attempted trying FSBS but had issues with meter. Had elevated postprandial values when did intermittent checks. She is GBS negative. She declines genetic screening   EFW on 8/17 was Baby A 5lbs 12oz; Baby B 4lbs 10oz.  OB History     Gravida  2   Para  1   Term  0   Preterm  1   AB  0   Living  2      SAB  0   IAB  0   Ectopic  0   Multiple  1   Live Births  2          Past Medical History:  Diagnosis Date   Allergic state 08/17/2016   Allergy    pt. reported seasonal allergies   Encounter for screening and preventative care 08/17/2016   Gastro-esophageal reflux 08/17/2016   Hyperlipidemia 08/30/2016   Myalgia 08/17/2016   Norovirus    Palpitations 09/21/2016   s/p stopping birth control pills   Past Surgical History:  Procedure Laterality Date   MANDIBLE SURGERY  08/28/2002   WISDOM TOOTH EXTRACTION     Family History: family history includes Breast cancer in her maternal grandmother; Diabetes in her maternal grandmother; Hearing loss in her paternal grandfather; Hypertension in her maternal grandmother; Lung cancer in her mother; Lymphoma in her paternal grandmother; Neuropathy in her paternal grandfather; Parkinson's disease in her father; Prostate cancer in her paternal grandfather; Stomach cancer in her paternal grandmother. Social History:  reports that she has never smoked. She has never used smokeless tobacco. She reports that she does not  currently use alcohol after a past usage of about 2.0 standard drinks per week. She reports that she does not use drugs.     Maternal Diabetes: Yes:  Diabetes Type:  Diet controlled Genetic Screening: Declined Maternal Ultrasounds/Referrals: Normal Fetal Ultrasounds or other Referrals:  None Maternal Substance Abuse:  No Significant Maternal Medications:  None Significant Maternal Lab Results:  Group B Strep negative Other Comments:  None  Review of Systems  Constitutional:  Negative for activity change and fatigue.  Eyes:  Negative for photophobia and visual disturbance.  Respiratory:  Negative for chest tightness and shortness of breath.   Cardiovascular:  Negative for chest pain, palpitations and leg swelling.  Gastrointestinal:  Negative for abdominal pain.  Genitourinary:  Positive for pelvic pain. Negative for vaginal bleeding.  Musculoskeletal:  Negative for back pain.  Neurological:  Negative for light-headedness and headaches.  Psychiatric/Behavioral:  The patient is nervous/anxious.   Maternal Medical History:  Reason for admission: Rupture of membranes.   Contractions: Frequency: irregular.   Perceived severity is mild.   Fetal activity: Perceived fetal activity is normal.   Prenatal complications: Preterm labor.   Prenatal Complications - Diabetes: Unknown - never completed screening.  Dilation: 2 Effacement (%): 80 Station: -3 Exam by:: R. Renato Battles, CNM Blood pressure 132/70, pulse (!) 105, temperature 98.3 F (36.8 C), temperature source Oral, resp. rate  18, SpO2 98 %, currently breastfeeding. Maternal Exam:  Uterine Assessment: Contraction strength is mild.  Contraction frequency is irregular.  Abdomen: Patient reports generalized tenderness.  Fetal presentation: vertex Introitus: Normal vulva. Vulva is negative for condylomata and lesion.  Normal vagina.  Vagina is negative for condylomata.  Amniotic fluid character: clear. Pelvis: adequate for delivery.    Cervix: Cervix evaluated by digital exam.     Fetal Exam Fetal Monitor Review: Baseline rate: 140 x 2.  Variability: moderate (6-25 bpm).   Pattern: accelerations present and no decelerations.   Fetal State Assessment: Category I - tracings are normal.  Physical Exam Vitals and nursing note reviewed. Exam conducted with a chaperone present.  Constitutional:      Appearance: Normal appearance.  Cardiovascular:     Pulses: Normal pulses.  Abdominal:     Tenderness: There is generalized abdominal tenderness.  Genitourinary:    General: Normal vulva.  Vulva is no lesion.  Musculoskeletal:        General: Normal range of motion.     Cervical back: Normal range of motion.  Skin:    General: Skin is warm.     Capillary Refill: Capillary refill takes 2 to 3 seconds.  Neurological:     General: No focal deficit present.     Mental Status: She is alert and oriented to person, place, and time. Mental status is at baseline.  Psychiatric:        Mood and Affect: Mood normal.        Behavior: Behavior normal.        Thought Content: Thought content normal.        Judgment: Judgment normal.    Prenatal labs: ABO, Rh: --/--/PENDING (09/04 1720) Antibody: PENDING (09/04 1720) Rubella:   RPR:    HBsAg:    HIV:    GBS:     Assessment/Plan: [id number] female at 36 5/7wks with di-di twins here with PPROM  - Admit - SVE :4/90/-2; forebag ruptured - Augmentation of labor with pitocin per protocol - GBS neg - Covid screen - Pain control prn  - Plan to deliver vaginally in OR - Confirmed vertex/vertex per bedside US   Joelyn Lover W Nahome Bublitz 05/01/2021, 6:06 PM

## 2021-05-01 NOTE — Anesthesia Procedure Notes (Signed)
Epidural Patient location during procedure: OB Start time: 05/01/2021 6:30 PM End time: 05/01/2021 6:45 PM  Staffing Anesthesiologist: Merlinda Frederick, MD Performed: anesthesiologist   Preanesthetic Checklist Completed: patient identified, IV checked, site marked, risks and benefits discussed, monitors and equipment checked, pre-op evaluation and timeout performed  Epidural Patient position: sitting Prep: DuraPrep Patient monitoring: heart rate, cardiac monitor, continuous pulse ox and blood pressure Approach: midline Location: L2-L3 Injection technique: LOR saline  Needle:  Needle type: Tuohy  Needle gauge: 17 G Needle length: 9 cm Needle insertion depth: 7 cm Catheter type: closed end flexible Catheter size: 20 Guage Catheter at skin depth: 12 cm Test dose: negative and Other  Assessment Events: blood not aspirated, injection not painful, no injection resistance and negative IV test  Additional Notes Informed consent obtained prior to proceeding including risk of failure, 1% risk of PDPH, risk of minor discomfort and bruising.  Discussed rare but serious complications including epidural abscess, permanent nerve injury, epidural hematoma.  Discussed alternatives to epidural analgesia and patient desires to proceed.  Timeout performed pre-procedure verifying patient name, procedure, and platelet count.  Patient tolerated procedure well.

## 2021-05-01 NOTE — MAU Provider Note (Signed)
History     CSN: [id number]  Arrival date and time: 05/01/21 1557   Event Date/Time   First Provider Initiated Contact with Patient 05/01/21 1657      Chief Complaint  Patient presents with   Contractions   Rupture of Membranes   Ms. Jenna Rhodes is a 30 y.o. year old G73P0102 female at 63w5dweeks gestation who presents to MAU reporting leaking clear fluid sijnce 1445 and UC's since 1515 about every 2-3 mins. She is pregnant with di-di twin boys. She denies VB. (+) FM.   OB History     Gravida  2   Para  1   Term  0   Preterm  1   AB  0   Living  2      SAB  0   IAB  0   Ectopic  0   Multiple  1   Live Births  2           Past Medical History:  Diagnosis Date   Allergic state 08/17/2016   Allergy    pt. reported seasonal allergies   Encounter for screening and preventative care 08/17/2016   Gastro-esophageal reflux 08/17/2016   Hyperlipidemia 08/30/2016   Myalgia 08/17/2016   Norovirus    Palpitations 09/21/2016   s/p stopping birth control pills    Past Surgical History:  Procedure Laterality Date   MANDIBLE SURGERY  08/28/2002   WISDOM TOOTH EXTRACTION      Family History  Problem Relation Age of Onset   Lung cancer Mother    Parkinson's disease Father    Diabetes Maternal Grandmother    Hypertension Maternal Grandmother    Breast cancer Maternal Grandmother    Stomach cancer Paternal Grandmother    Lymphoma Paternal Grandmother    Neuropathy Paternal Grandfather    Hearing loss Paternal Grandfather        s/p bypass   Prostate cancer Paternal Grandfather     Social History   Tobacco Use   Smoking status: Never   Smokeless tobacco: Never  Vaping Use   Vaping Use: Never used  Substance Use Topics   Alcohol use: Not Currently    Alcohol/week: 2.0 standard drinks    Types: 2 Glasses of wine per week   Drug use: No    Allergies:  Allergies  Allergen Reactions   Amoxicillin Rash    Did it involve swelling of the  face/tongue/throat, SOB, or low BP? No Did it involve sudden or severe rash/hives, skin peeling, or any reaction on the inside of your mouth or nose? Yes Did you need to seek medical attention at a hospital or doctor's office? No When did it last happen?      within last 10 years If all above answers are "NO", may proceed with cephalosporin use.    Clindamycin/Lincomycin Rash   Mobic [Meloxicam] Rash    Medications Prior to Admission  Medication Sig Dispense Refill Last Dose   Prenatal Vit-Fe Fumarate-FA (MULTIVITAMIN-PRENATAL) 27-0.8 MG TABS tablet Take 1 tablet by mouth daily at 12 noon.       Review of Systems  Constitutional: Negative.   HENT: Negative.    Eyes: Negative.   Respiratory: Negative.    Cardiovascular: Negative.   Gastrointestinal: Negative.   Endocrine: Negative.   Genitourinary:  Positive for vaginal discharge (leaking clear fluid since 1445).  Musculoskeletal: Negative.   Skin: Negative.   Allergic/Immunologic: Negative.   Neurological: Negative.   Hematological: Negative.   Psychiatric/Behavioral: Negative.  Physical Exam   Patient Vitals for the past 24 hrs:  BP Temp Temp src Pulse Resp SpO2  05/01/21 1858 139/88 -- -- (!) 130 -- --  05/01/21 1730 132/70 -- -- (!) 105 -- 98 %  05/01/21 1725 -- -- -- -- -- 98 %  05/01/21 1720 -- -- -- -- -- 98 %  05/01/21 1715 -- -- -- -- -- 97 %  05/01/21 1710 -- -- -- -- -- 98 %  05/01/21 1705 -- -- -- -- -- 98 %  05/01/21 1701 136/78 -- -- (!) 110 -- --  05/01/21 1700 -- -- -- -- -- 97 %  05/01/21 1655 -- -- -- -- -- 98 %  05/01/21 1650 -- -- -- -- -- 98 %  05/01/21 1645 -- -- -- -- -- 98 %  05/01/21 1642 (!) 138/93 -- -- -- -- --  05/01/21 1628 (!) 144/96 98.3 F (36.8 C) Oral (!) 122 18 --  05/01/21 1627 -- -- -- -- -- 98 %    Physical Exam Constitutional:      Appearance: Normal appearance.  Cardiovascular:     Rate and Rhythm: Tachycardia present.  Pulmonary:     Effort: Pulmonary effort is  normal.  Abdominal:     Palpations: Abdomen is soft.  Genitourinary:    General: Normal vulva.     Comments: Dilation: 2 Effacement (%): 80 Station: -3 Presentation: Vertex Exam by: Sunday Corn, CNM Skin:    General: Skin is warm and dry.  Neurological:     Mental Status: She is alert and oriented to person, place, and time.  Psychiatric:        Mood and Affect: Mood normal.        Behavior: Behavior normal.        Thought Content: Thought content normal.        Judgment: Judgment normal.   REACTIVE NST - FHR (A): 145 bpm / moderate variability / accels present / decels absent / REACTIVE NST - FHR (B): 135 bpm / moderate variability / accels present / decels absent / TOCO: regular every 2-3 mins   MAU Course  Procedures  MDM CCUA CBC CMP P/C Ratio Serial BP's   Results for orders placed or performed during the hospital encounter of 05/01/21 (from the past 24 hour(s))  Fern Test     Status: Abnormal   Collection Time: 05/01/21  4:45 PM  Result Value Ref Range   POCT Fern Test Positive = ruptured amniotic membanes   CBC     Status: Abnormal   Collection Time: 05/01/21  5:05 PM  Result Value Ref Range   WBC 14.1 (H) 4.0 - 10.5 K/uL   RBC 4.15 3.87 - 5.11 MIL/uL   Hemoglobin 12.6 12.0 - 15.0 g/dL   HCT 38.0 36.0 - 46.0 %   MCV 91.6 80.0 - 100.0 fL   MCH 30.4 26.0 - 34.0 pg   MCHC 33.2 30.0 - 36.0 g/dL   RDW 13.5 11.5 - 15.5 %   Platelets 211 150 - 400 K/uL   nRBC 0.0 0.0 - 0.2 %  Comprehensive metabolic panel     Status: Abnormal   Collection Time: 05/01/21  5:05 PM  Result Value Ref Range   Sodium 134 (L) 135 - 145 mmol/L   Potassium 3.8 3.5 - 5.1 mmol/L   Chloride 107 98 - 111 mmol/L   CO2 19 (L) 22 - 32 mmol/L   Glucose, Bld 78 70 - 99 mg/dL  BUN 10 6 - 20 mg/dL   Creatinine, Ser 0.64 0.44 - 1.00 mg/dL   Calcium 8.9 8.9 - 10.3 mg/dL   Total Protein 6.0 (L) 6.5 - 8.1 g/dL   Albumin 2.5 (L) 3.5 - 5.0 g/dL   AST 28 15 - 41 U/L   ALT 16 0 - 44 U/L   Alkaline  Phosphatase 157 (H) 38 - 126 U/L   Total Bilirubin 0.3 0.3 - 1.2 mg/dL   GFR, Estimated >60 >60 mL/min   Anion gap 8 5 - 15  Type and screen [place]     Status: None   Collection Time: 05/01/21  5:20 PM  Result Value Ref Range   ABO/RH(D) O POS    Antibody Screen NEG    Sample Expiration      05/04/2021,2359 Performed at [facility], [place] [address], [place], [place] [address]       Assessment and Plan  Preterm premature rupture of membranes in third trimester, unspecified duration to onset of labor  2. [redacted] weeks gestation of pregnancy  3. Dichorionic diamniotic twin pregnancy in third trimester   - Advised RN to call OB on call for [facility] for admission orders - Routine admission orders - PEC labs pending  Laury Deep, CNM 05/01/2021, 5:04 PM

## 2021-05-02 ENCOUNTER — Encounter (HOSPITAL_COMMUNITY): Payer: Self-pay | Admitting: Obstetrics and Gynecology

## 2021-05-02 LAB — RPR: RPR Ser Ql: NONREACTIVE

## 2021-05-02 MED ORDER — IBUPROFEN 600 MG PO TABS
600.0000 mg | ORAL_TABLET | Freq: Four times a day (QID) | ORAL | Status: DC
  Administered 2021-05-02 – 2021-05-04 (×9): 600 mg via ORAL
  Filled 2021-05-02 (×8): qty 1

## 2021-05-02 MED ORDER — OXYCODONE HCL 5 MG PO TABS
10.0000 mg | ORAL_TABLET | ORAL | Status: DC | PRN
Start: 2021-05-02 — End: 2021-05-04

## 2021-05-02 MED ORDER — DIPHENHYDRAMINE HCL 25 MG PO CAPS: 25.0000 mg | ORAL_CAPSULE | Freq: Four times a day (QID) | ORAL | Status: DC | PRN

## 2021-05-02 MED ORDER — PRENATAL MULTIVITAMIN CH
1.0000 | ORAL_TABLET | Freq: Every day | ORAL | Status: DC
  Administered 2021-05-03 – 2021-05-04 (×2): 1 via ORAL
  Filled 2021-05-02 (×2): qty 1

## 2021-05-02 MED ORDER — TETANUS-DIPHTH-ACELL PERTUSSIS 5-2.5-18.5 LF-MCG/0.5 IM SUSY: 0.5000 mL | PREFILLED_SYRINGE | Freq: Once | INTRAMUSCULAR | Status: DC

## 2021-05-02 MED ORDER — ACETAMINOPHEN 325 MG PO TABS
650.0000 mg | ORAL_TABLET | ORAL | Status: DC | PRN
  Administered 2021-05-02 – 2021-05-03 (×4): 650 mg via ORAL
  Filled 2021-05-02 (×5): qty 2

## 2021-05-02 MED ORDER — ONDANSETRON HCL 4 MG PO TABS: 4.0000 mg | ORAL_TABLET | ORAL | Status: DC | PRN

## 2021-05-02 MED ORDER — WITCH HAZEL-GLYCERIN EX PADS
1.0000 "application " | MEDICATED_PAD | CUTANEOUS | Status: DC | PRN
  Administered 2021-05-02: 1 via TOPICAL

## 2021-05-02 MED ORDER — DIBUCAINE (PERIANAL) 1 % EX OINT: 1.0000 "application " | TOPICAL_OINTMENT | CUTANEOUS | Status: DC | PRN

## 2021-05-02 MED ORDER — COCONUT OIL OIL: 1.0000 "application " | TOPICAL_OIL | Status: DC | PRN

## 2021-05-02 MED ORDER — BENZOCAINE-MENTHOL 20-0.5 % EX AERO
1.0000 "application " | INHALATION_SPRAY | CUTANEOUS | Status: DC | PRN
  Administered 2021-05-02 – 2021-05-04 (×2): 1 via TOPICAL
  Filled 2021-05-02 (×2): qty 56

## 2021-05-02 MED ORDER — SENNOSIDES-DOCUSATE SODIUM 8.6-50 MG PO TABS
2.0000 | ORAL_TABLET | Freq: Every day | ORAL | Status: DC
  Administered 2021-05-03 – 2021-05-04 (×2): 2 via ORAL
  Filled 2021-05-02 (×2): qty 2

## 2021-05-02 MED ORDER — ZOLPIDEM TARTRATE 5 MG PO TABS: 5.0000 mg | ORAL_TABLET | Freq: Every evening | ORAL | Status: DC | PRN

## 2021-05-02 MED ORDER — OXYCODONE HCL 5 MG PO TABS
5.0000 mg | ORAL_TABLET | ORAL | Status: DC | PRN
Start: 2021-05-02 — End: 2021-05-04

## 2021-05-02 MED ORDER — OXYTOCIN-SODIUM CHLORIDE 30-0.9 UT/500ML-% IV SOLN: 2.5000 [IU]/h | INTRAVENOUS | Status: DC | PRN

## 2021-05-02 MED ORDER — LACTATED RINGERS IV SOLN: INTRAVENOUS | Status: DC

## 2021-05-02 MED ORDER — SIMETHICONE 80 MG PO CHEW: 80.0000 mg | CHEWABLE_TABLET | ORAL | Status: DC | PRN

## 2021-05-02 MED ORDER — ONDANSETRON HCL 4 MG/2ML IJ SOLN: 4.0000 mg | INTRAMUSCULAR | Status: DC | PRN

## 2021-05-02 NOTE — Anesthesia Postprocedure Evaluation (Signed)
Anesthesia Post Note  Patient: Jenna Rhodes  Procedure(s) Performed: AN AD HOC LABOR EPIDURAL     Patient location during evaluation: Mother Baby Anesthesia Type: Epidural Level of consciousness: awake Pain management: satisfactory to patient Vital Signs Assessment: post-procedure vital signs reviewed and stable Respiratory status: spontaneous breathing Cardiovascular status: stable Anesthetic complications: no   No notable events documented.  Last Vitals:  Vitals:   05/02/21 0904 05/02/21 1020  BP: 128/85 136/89  Pulse: (!) 102 99  Resp: 16 18  Temp: 36.6 C 36.9 C  SpO2: 98% 99%    Last Pain:  Vitals:   05/02/21 1020  TempSrc: Oral  PainSc: 3    Pain Goal: Patients Stated Pain Goal: 2 (05/02/21 0910)                 Casimer Lanius

## 2021-05-02 NOTE — Plan of Care (Signed)
  Problem: Education: Goal: Knowledge of condition will improve Note: Admission education, safety and unit protocols reviewed with patient and significant other. Set up DEBP and discussed set up, cleaning and frequency with patient and significant other. Jenna Rhodes, Jenna Rhodes

## 2021-05-02 NOTE — Lactation Note (Signed)
This note was copied from a baby's chart. Lactation Consultation Note  Patient Name: Jenna Rhodes [id number] Date: 05/02/2021 Reason for consult: Initial assessment;Mother's request;Difficult latch;Primapara;1st time breastfeeding;Late-preterm 34-36.6wks Age:31 hours  LC did some suck training to assist with bringing tongue down. Infant latched for 15 min with audible swallows.   LPTI guidelines reviewed to reduce calorie loss including keeping total feeding under 30 min  Plan 1. To feed by cues 8-12x 24 hr period  2. Mom to offer breasts and listen for audible swallows.  3. Mom to supplement with EBM via spoon 5 ml after latching.  4. Mom to pump with dEBP q 3hr for 15 min  5. I and O sheet reviewed.  6. [place] brochure of inpatient and outpatient services reviewed.  All questions answered at the end of the visit.   Baby B sleepy at the breast. LC did suck training able to extend tongue and latch to finger. LC attempted latch at breast but he did not initiate a suck. Infant fed 2 ml of EBM. Mom going to bathroom pump with DEBP see what she gets to offer more.   LC to review options of supplementation using DBM/ pace feeding.   Maternal Data Has patient been taught Hand Expression?: Yes Does the patient have breastfeeding experience prior to this delivery?: Yes How long did the patient breastfeed?: multiples ( set of twin girls BF 1 1/2 years)  Feeding Mother's Current Feeding Choice: Breast Milk  LATCH Score Latch: Repeated attempts needed to sustain latch, nipple held in mouth throughout feeding, stimulation needed to elicit sucking reflex.  Audible Swallowing: Spontaneous and intermittent  Type of Nipple: Everted at rest and after stimulation  Comfort (Breast/Nipple): Soft / non-tender  Hold (Positioning): Assistance needed to correctly position infant at breast and maintain latch.  LATCH Score: 8   Lactation Tools Discussed/Used Tools: Pump;Flanges Flange Size:  24 Breast pump type: Double-Electric Breast Pump Pump Education: Setup, frequency, and cleaning;Milk Storage Reason for Pumping: increase stimulation Pumping frequency: every 3 hrs for 15 min  Interventions Interventions: Breast feeding basics reviewed;Breast compression;Assisted with latch;Adjust position;Skin to skin;Support pillows;Breast massage;Position options;Hand express;Expressed milk;Education;DEBP  Discharge Pump: Personal  Consult Status Consult Status: Follow-up Date: 05/03/21 Follow-up type: In-patient    Plez Belton  Nicholson-Springer 05/02/2021, 12:46 PM

## 2021-05-02 NOTE — Progress Notes (Signed)
Patient ID: Jenna Rhodes, female   DOB: [birth year], 30 y.o.   MRN: [id number] Pt doing well. Feeling some mild pelvic pressure  Completely dilated and at +1 station  Plan to start pushing   Anticipate svd x 2

## 2021-05-02 NOTE — Lactation Note (Signed)
This note was copied from a baby's chart. Lactation Consultation Note  Patient Name: Jenna Rhodes [id number] Date: 05/02/2021 Reason for consult: Initial assessment;Mother's request;Difficult latch;Late-preterm 31-36.6wks;Multiple gestation;Infant < 6lbs Age:31 hours  LC attempted latching on breast but infant not suck sleepy at breast.  LC gave 2 ml of colostrum and pace bottle fed with purple nipple 7 ml of DBM.  Plan  To feed based on cues 8-12x 24 hr Mom to offer breast and look for audible swallows Mom to supplement with pace bottle feeding and extra slow flow nipple EBM first followed by DBM 7-10 ml. Mom aware to offer more if infant not latching.  DEBP q 3 hrs for 15 min  I and O sheet reviewed [place] brochure of inpatient and outpatient services reviewed.   Maternal Data Has patient been taught Hand Expression?: Yes  Feeding Mother's Current Feeding Choice: Breast Milk and Donor Milk Nipple Type: Extra Slow Flow  LATCH Score                    Lactation Tools Discussed/Used    Interventions Interventions: Breast feeding basics reviewed;Breast compression;Assisted with latch;Adjust position;Hand pump;Skin to skin;Support pillows;DEBP;Breast massage;Position options;Hand express;Expressed milk;Education;Pace feeding  Discharge    Consult Status Consult Status: Follow-up Date: 05/03/21 Follow-up type: In-patient    Jenna Rhodes  Jenna Rhodes 05/02/2021, 1:18 PM

## 2021-05-02 NOTE — Lactation Note (Signed)
This note was copied from a baby's chart. Lactation Consultation Note  Patient Name: Jenna Rhodes [id number] Date: 05/02/2021   Age:31 hours   In to assist with latch per request of mother, states with difficulty latching baby B. Baby A currently at right breast cross cradle, [place] advised after latching hold baby close to prevent slipping back to nipple. Mom reports some tenderness (better than before) and c/o uterine cramping.  Assisted with latching Baby B, however baby without cues, dad reports baby just completed 352ms of DBM. Mom held baby skin to skin.  Plan - feed on cue 8-12 times a day - keep feeds to 329m or less - wake if >3hrs since last feeding - pump after feeds, supplement with EBM/DBM - continuous skin to skin while awake and alert - Baby B- next feed if with difficulty latching to breast offer 2-52m9mBM/DBM, allow baby to suck on finger, then latch to breast. - call for lactation support if needed prior to next visit  Parents voiced understanding and with no further concerns. BGilliam, RN, IBCLC    Feeding Nipple Type: Extra Slow Flow  LATCH Score Latch: Too sleepy or reluctant, no latch achieved, no sucking elicited.  Audible Swallowing: None  Type of Nipple: Everted at rest and after stimulation  Comfort (Breast/Nipple): Filling, red/small blisters or bruises, mild/mod discomfort  Hold (Positioning): No assistance needed to correctly position infant at breast.  LATCH Score: 5    Interventions Interventions: Breast feeding basics reviewed;Assisted with latch;Skin to skin;DEBP    Consult Status Consult Status: Follow-up Date: 05/03/21 Follow-up type: In-patient    BroBernita Buffy5/2022, 9:49 PM

## 2021-05-02 NOTE — Progress Notes (Signed)
Patient ID: Jenna Rhodes, female   DOB: July 14, 1991, 30 y.o.   MRN: [id number] Pt resting comfortably; no complaints. +Fms VSS GEN - NAD EFM A: 125, cat 1          B: 125, cat 1 TOCO: contractions q 1-50mns SVE 6.5/90/-1  A/P: Progressing in labor        Augment with low dose pitocin         Expectant management

## 2021-05-02 NOTE — Lactation Note (Addendum)
This note was copied from a baby's chart. Lactation Consultation Note  Patient Name: Jenna Rhodes [id number] Date: 05/02/2021 Reason for consult: Follow-up assessment;Mother's request;Difficult latch;Late-preterm 34-36.6wks;Multiple gestation Age:31 hours  Both baby  A and B not able to latch at this feeding.  Baby A arching back and tongue thrusting.  Baby B sleepy at the breast and will not initiate a latch.  Both infants fed 7 ml of DBM at this visit.   Plan 1. To feed based on cues 8-12x 24 hr  2. Mom to work on latching infant at breast. If needed, Mom to call RN or [facility] for latch assistance. Mom to pull chin down to bring tongue down during feeding, ensure lips are flanged  3. Dad to supplement with pace bottle feeding with exta slow flow nipple EBM first followed by DBM  4. DEPB q 3hrs for 15 min. LC decreased flange size from 24 to 21, mother states better fit. RN, to provide coconut oil for nipple care before pumping.  All questions answered at the end of the visit.   Maternal Data    Feeding Mother's Current Feeding Choice: Breast Milk and Donor Milk Nipple Type: Extra Slow Flow  LATCH Score Latch:  (LC attempted latch infant sleepy at the breast.)                  Lactation Tools Discussed/Used Tools: Pump;Flanges;Coconut oil Flange Size: 21 Breast pump type: Double-Electric Breast Pump Pump Education: Setup, frequency, and cleaning;Milk Storage Reason for Pumping: increase stimulation Pumping frequency: every 3 hrs for 15 min  Interventions Interventions: Breast feeding basics reviewed;Breast compression;Assisted with latch;Adjust position;Skin to skin;Support pillows;DEBP;Breast massage;Position options;Hand express;Expressed milk;Education;Coconut oil;Pace feeding  Discharge    Consult Status Consult Status: Follow-up Date: 05/03/21 Follow-up type: In-patient    Chandra Feger  Nicholson-Springer 05/02/2021, 4:45 PM

## 2021-05-02 NOTE — Lactation Note (Signed)
This note was copied from a baby's chart. Lactation Consultation Note  Patient Name: Jenna Rhodes [id number] Date: 05/02/2021 Reason for consult: Follow-up assessment;Mother's request;Difficult latch;Late-preterm 34-36.6wks;Multiple gestation Age:31 hours  In to assist with latch per request of mother, states with difficulty latching baby B. Baby A currently at right breast cross cradle, LC advised after latching hold baby close to prevent slipping back to nipple. Mom reports some tenderness (better than before) and c/o uterine cramping.  Assisted with latching Baby B, however baby without cues, dad reports baby just completed 52ms of DBM. Mom held baby skin to skin.  Plan - feed on cue 8-12 times a day - keep feeds to 372m or less - wake if >3hrs since last feeding - pump after feeds, supplement with EBM/DBM - continuous skin to skin while awake and alert - Baby B- next feed if with difficulty latching to breast offer 2-42m25mBM/DBM, allow baby to suck on finger, then latch to breast. - call for lactation support if needed prior to next visit  Parents voiced understanding and with no further concerns. BGilliam, RN, IBCLC   Feeding Mother's Current Feeding Choice: Breast Milk and Donor Milk Nipple Type: Extra Slow Flow  LATCH Score Latch: Grasps breast easily, tongue down, lips flanged, rhythmical sucking.  Audible Swallowing: Spontaneous and intermittent  Type of Nipple: Everted at rest and after stimulation  Comfort (Breast/Nipple): Filling, red/small blisters or bruises, mild/mod discomfort  Hold (Positioning): No assistance needed to correctly position infant at breast.  LATCH Score: 9    Interventions Interventions: Breast feeding basics reviewed;Assisted with latch;Skin to skin;DEBP  Discharge    Consult Status Consult Status: Follow-up Date: 05/03/21 Follow-up type: In-patient    BroBernita Buffy5/2022, 9:41 PM

## 2021-05-03 LAB — CBC
HCT: 33.7 % — ABNORMAL LOW (ref 36.0–46.0)
Hemoglobin: 11.3 g/dL — ABNORMAL LOW (ref 12.0–15.0)
MCH: 30.8 pg (ref 26.0–34.0)
MCHC: 33.5 g/dL (ref 30.0–36.0)
MCV: 91.8 fL (ref 80.0–100.0)
Platelets: 212 10*3/uL (ref 150–400)
RBC: 3.67 MIL/uL — ABNORMAL LOW (ref 3.87–5.11)
RDW: 13.8 % (ref 11.5–15.5)
WBC: 15.5 10*3/uL — ABNORMAL HIGH (ref 4.0–10.5)
nRBC: 0 % (ref 0.0–0.2)

## 2021-05-03 NOTE — Lactation Note (Addendum)
This note was copied from a baby's chart. Lactation Consultation Note  Patient Name: Jenna Rhodes [id number] Date: 05/03/2021 Reason for consult: Follow-up assessment;Late-preterm 34-36.6wks;Infant < 6lbs;Multiple gestation (Twin "B" < 6 lbs) Age:31 hours   P4 mother whose infant twin boys are now 25 hours old.  These are LPTI boys born at 35+6 weeks with a CGA of 36+0 weeks.  Mother breast fed her first set of twins(now 31 years old)  for 1-1/2 years.  Mother's current feeding preference is breast/donor milk.  Both babies are breast feeding prior to donor milk supplementation.  Baby "A" last fed for 20 minutes at the breast followed by 15 mls of donor breast milk.  Baby "B" has not breast fed since last night and has been consuming between 5-15 mls of donor breast milk.  Babies are not ready to feed at this time.  Reviewed the LPTI guidelines with family.  Placed hats on babies and encouraged lots of STS with parents.  Reviewed feeding plan for today and asked parents to increase volumes to at least 10-20 mls after every breast feeding following the guidelines.  Mother has been trying to pump consistently, however, finds it difficult to have enough time to do this.  Discussed helpful ways to increase time available for pumping and rest.  Observed mother pumping with the #24 flanges and lubricated the flanges for greater comfort.  She was using the #21 size but stated that size was too small and created red rings to her breast.  Mother stated no pain with the #24 flanges.  After 15 minutes of pumping she was able to obtain 1 ml of colostrum.  Set up wash station and reviewed cleaning.  Mother will use EBM/coconut oil for comfort after feeding or pumping.  Made a "hands free" bra for mother to use during pumping.  Mother has a DEBP for home use.  Father present and very supportive.  Parents involved in a "twins group" for support. Encouraged to call for any questions or for latch assistance as needed.   RN updated.    Maternal Data Has patient been taught Hand Expression?: Yes Does the patient have breastfeeding experience prior to this delivery?: Yes How long did the patient breastfeed?: 1-1/2 years  Feeding Mother's Current Feeding Choice: Breast Milk and Donor Milk  LATCH Score                    Lactation Tools Discussed/Used Tools: Pump;Flanges;Coconut oil Flange Size: 24 Breast pump type: Double-Electric Breast Pump;Manual Pump Education: Milk Storage (Reviewed) Reason for Pumping: Breast stimulation and supplementation for LPTI twins Pumping frequency: Every three hours Pumped volume: 1 mL  Interventions    Discharge Pump: DEBP;Manual;Personal WIC Program: No  Consult Status Consult Status: Follow-up Date: 05/04/21 Follow-up type: In-patient    Daley Mooradian R Andreina Outten 05/03/2021, 12:13 PM

## 2021-05-03 NOTE — Lactation Note (Signed)
This note was copied from a baby's chart. Lactation Consultation Note  Patient Name: Jenna Rhodes [id number] Date: 05/03/2021   Age:31 hours   Per RN,  delay lactation services until later today or prn as mother desires.   Maternal Data    Feeding    LATCH Score Latch: Repeated attempts needed to sustain latch, nipple held in mouth throughout feeding, stimulation needed to elicit sucking reflex.  Audible Swallowing: A few with stimulation  Type of Nipple: Everted at rest and after stimulation  Comfort (Breast/Nipple): Soft / non-tender  Hold (Positioning): Assistance needed to correctly position infant at breast and maintain latch.  LATCH Score: 7   Lactation Tools Discussed/Used    Interventions    Discharge    Consult Status      [name] R Reise Hietala 05/03/2021, 5:01 AM

## 2021-05-03 NOTE — Progress Notes (Signed)
Patient is eating, ambulating, voiding.  Pain control is good, but pt complains of back pain limiting her ability to move around.   Vitals:   05/02/21 1450 05/02/21 1744 05/02/21 2100 05/03/21 0408  BP: 131/78 113/64 124/80 124/82  Pulse: 95 89 98 82  Resp: '16 20 20 18  '$ Temp: (!) 97.4 F (36.3 C) 98.2 F (36.8 C) 98.2 F (36.8 C) 97.7 F (36.5 C)  TempSrc: Oral Oral Oral Oral  SpO2: 97%     Weight:        Fundus firm Abd: soft, nontender Ext: +pedal edema, no calf tenderness  Lab Results  Component Value Date   WBC 15.5 (H) 05/03/2021   HGB 11.3 (L) 05/03/2021   HCT 33.7 (L) 05/03/2021   MCV 91.8 05/03/2021   PLT 212 05/03/2021    --/--/O POS (09/04 1720)  A/P Post partum day 1 s/p NSVD x2 of di/di twins Per RN one baby with hypospadias for which peds recommends circ with urology.  Offered to circ other baby but patient declined, wants both babies circ'd at same time.  Advised may be significantly more expensive.  Pt requested contact info for a urology, she and husband plan to contact insurance and urology today and decide prior to d/c likely tomorrow. Reviewed gentle stretching exercise and discussed increased ambulation.  Routine care.    Allyn Kenner

## 2021-05-04 LAB — SURGICAL PATHOLOGY

## 2021-05-04 MED ORDER — IBUPROFEN 600 MG PO TABS: 600.0000 mg | ORAL_TABLET | Freq: Four times a day (QID) | ORAL | 0 refills | Status: AC

## 2021-05-04 NOTE — Discharge Summary (Signed)
Postpartum Discharge Summary  Date of Service updated 05/04/21     Patient Name: Jenna Rhodes DOB: 25-Jun-1991 MRN: [id number]  Date of admission: 05/01/2021 Delivery date:   Jenna Rhodes [[id number]]  05/02/2021    Jenna Rhodes [[id number]]  05/02/2021  Delivering provider:    [name], Rhodes [[id number]]  Jenna Rhodes [[id number]]  [name] [facility]  Date of discharge: 05/04/2021  Admitting diagnosis: Delayed delivery after SROM (spontaneous rupture of membranes) [O42.90] Intrauterine pregnancy: [redacted]w[redacted]d    Secondary diagnosis:  Active Problems:   Delayed delivery after SROM (spontaneous rupture of membranes)   Twin birth  Additional problems: None    Discharge diagnosis: Preterm Pregnancy Delivered                                              Post partum procedures: none Augmentation: Pitocin Complications: None  Hospital course: Onset of Labor and PPROM With Vaginal Delivery      30y.o. yo [id number] 31w6das admitted in Latent Labor on 05/01/2021 with PPROM.  She was augmented with pitocin. Patient had an uncomplicated labor course as follows: She delivered twin via vaginal delivery, both via cephalic position.  Membrane Rupture Time/Date:    Jenna Rhodes2:45 PM    Jenna Rhodes2:45 PM ,   Jenna Rhodes9/11/2020    Jenna Rhodes9/11/2020   Delivery Method:   Jenna RhodesVaginal, Spontaneous    Jenna RhodesVaginal, Spontaneous  Episiotomy:    Jenna RhodesNone    Jenna RhodesNone  Lacerations:     Jenna Rhodes2nd degree    Jenna Rhodes2nd degree  Patient had an uncomplicated postpartum course.  She is ambulating, tolerating a regular diet, passing flatus, and urinating  well. Patient is discharged home in stable condition on 05/04/21.  Newborn Data: Birth date:   Jenna Rhodes9/12/2020    Jenna Rhodes9/12/2020  Birth time:   Jenna Rhodes5:02 AM    Jenna Rhodes5:09 AM  Gender:   Jenna RhodesFemale    Jenna RhodesFemale  Living status:   Jenna RhodesLiving    Jenna RhodesLiving  Apgars:   Jenna Rhodes8 [address]0[[id number]]8 ,   J[place]9 [address] [place][[id number]]9  Weight:   Jenna Rhodes2960 g    Jenna Rhodes2470 g   Magnesium Sulfate received: No BMZ received: No Rhophylac:N/A MMR:No   Physical exam  Vitals:   05/02/21 2100 05/03/21 0408 05/03/21 2031 05/04/21 0531  BP: 124/80 124/82 120/82 128/86  Pulse: 98 82 (!) 101 88  Resp: _0 Temp: 98.2 F (36.8 C) 97.7 F (36.5 C) 97.9 F (36.6 C)   TempSrc: Oral Oral Oral   SpO2:   97% 98%  Weight:       General: alert, cooperative,  and no distress Lochia: appropriate Uterine Fundus: firm Incision: N/A DVT Evaluation: No evidence of DVT seen on physical exam. Labs: Lab Results  Component Value Date   WBC 15.5 (H) 05/03/2021   HGB 11.3 (L) 05/03/2021   HCT 33.7 (L) 05/03/2021   MCV 91.8 05/03/2021   PLT 212 05/03/2021   CMP Latest Ref Rng & Units 05/01/2021  Glucose 70 - 99 mg/dL 78  BUN 6 - 20 mg/dL 10  Creatinine 0.44 - 1.00 mg/dL 0.64  Sodium 135 - 145 mmol/L 134(L)  Potassium 3.5 - 5.1 mmol/L 3.8  Chloride 98 - 111 mmol/L 107  CO2 22 - 32 mmol/L 19(L)  Calcium 8.9 - 10.3 mg/dL 8.9  Total Protein 6.5 - 8.1 g/dL 6.0(L)  Total Bilirubin 0.3 - 1.2 mg/dL 0.3  Alkaline Phos 38 - 126 U/L 157(H)  AST 15 - 41 U/L 28  ALT 0 - 44 U/L 16   Edinburgh  Score: Edinburgh Postnatal Depression Scale Screening Tool 05/04/2021  I have been able to laugh and see the funny side of things. 0  I have looked forward with enjoyment to things. 0  I have blamed myself unnecessarily when things went wrong. 0  I have been anxious or worried for no good reason. 0  I have felt scared or panicky for no good reason. 0  Things have been getting on top of me. 0  I have been so unhappy that I have had difficulty sleeping. 0  I have felt sad or miserable. 0  I have been so unhappy that I have been crying. 0  The thought of harming myself has occurred to me. 0  Edinburgh Postnatal Depression Scale Total 0      After visit meds:  Allergies as of 05/04/2021       Reactions   Amoxicillin Rash   Did it involve swelling of the face/tongue/throat, SOB, or low BP? No Did it involve sudden or severe rash/hives, skin peeling, or any reaction on the inside of your mouth or nose? Yes Did you need to seek medical attention at a hospital or doctor's office? No When did it last happen?      within last 10 years If all above answers are "NO", may proceed with cephalosporin use.   Clindamycin/lincomycin Rash   Mobic [meloxicam] Rash        Medication List     TAKE these medications    ibuprofen 600 MG tablet Commonly known as: ADVIL Take 1 tablet (600 mg total) by mouth every 6 (six) hours.   phenylephrine-shark liver oil-mineral oil-petrolatum 0.25-14-74.9 % rectal ointment Commonly known as: PREPARATION H Place 1 application rectally 2 (two) times daily as needed for hemorrhoids.   witch hazel-glycerin pad Commonly known as: TUCKS Apply 1 application topically as needed for itching.         Discharge home in stable condition Infant Feeding: Breast Infant Disposition:rooming in Discharge instruction: per After Visit Summary and Postpartum booklet. Activity: Advance as tolerated. Pelvic rest for 6 weeks.  Diet: routine diet Anticipated Birth  Control: Unsure Postpartum Appointment:6 weeks Additional Postpartum F/U:  n/a Future Appointments:No future appointments. Follow up Visit:  [place], DO Follow up in 6 week(s).   Specialty: Obstetrics and Gynecology Contact information: [place] [address] [phone]                     05/04/2021 [place],  MD   

## 2021-05-04 NOTE — Progress Notes (Signed)
Patient is eating, ambulating, voiding.  Pain control is appropriate.  Just fatigued from minimal sleep   Vitals:   05/02/21 2100 05/03/21 0408 05/03/21 2031 05/04/21 0531  BP: 124/80 124/82 120/82 128/86  Pulse: 98 82 (!) 101 88  Resp: '20 18 18 18  '$ Temp: 98.2 F (36.8 C) 97.7 F (36.5 C) 97.9 F (36.6 C)   TempSrc: Oral Oral Oral   SpO2:   97% 98%  Weight:        Fundus firm Abd: soft, nontender Ext: +pedal edema, no calf tenderness  Lab Results  Component Value Date   WBC 15.5 (H) 05/03/2021   HGB 11.3 (L) 05/03/2021   HCT 33.7 (L) 05/03/2021   MCV 91.8 05/03/2021   PLT 212 05/03/2021    --/--/O POS (09/04 1720)  A/P Post partum day 2 s/p NSVD x2 of di/di twins at 79 weeks Patient is meeting all goals and is ready for discharge to home Discussed with peds NP, who recommends baby stays an extra day.  Mom can room in  Baby B with hypospadias and planned outpatient evaluation with peds urology.  Discussed again option for circumcision for baby A in hospital.  After long discussion with MOB and FOB, they are undecided if they would even pursue circumcision for baby A so elect to defer and plan to discuss with the pediatric urologist at time of visit for baby B     [place]

## 2021-05-04 NOTE — Lactation Note (Signed)
This note was copied from a baby's chart. Lactation Consultation Note  Patient Name: Jenna Rhodes [id number] Date: 05/04/2021 Reason for consult: Follow-up assessment Age:30 Mother of twins request tips on pumping .  Mother sitting on side of bed with homemade hands free bra on. Flanges not getting good suction.  Suggested that mother hold flanges in place with her hands and lean forward when pumping so flow of milk would run into the bottles and not leak down bra.   Suggested that mother hand express before/ after pumping.  Advised mother to pump on standard setting since she has pumped good volume each time. Advised mother to pump until the last drop drops and add additional few mins before she stops pumping.  Mother was offered to assist with feeding infants and she reports that it was not time for feedings yet. Mother to call when needed.   Maternal Data    Feeding Mother's Current Feeding Choice: Breast Milk Nipple Type: Dr. Clement Husbands  LATCH Score Latch: Repeated attempts needed to sustain latch, nipple held in mouth throughout feeding, stimulation needed to elicit sucking reflex.  Audible Swallowing: A few with stimulation  Type of Nipple: Everted at rest and after stimulation  Comfort (Breast/Nipple): Filling, red/small blisters or bruises, mild/mod discomfort  Hold (Positioning): Assistance needed to correctly position infant at breast and maintain latch.  LATCH Score: 6   Lactation Tools Discussed/Used    Interventions    Discharge    Consult Status Consult Status: Follow-up Date: 05/04/21 Follow-up type: In-patient    Jess Barters [facility] 05/04/2021, 3:16 PM

## 2021-05-05 ENCOUNTER — Ambulatory Visit: Payer: Self-pay

## 2021-05-05 NOTE — Lactation Note (Signed)
This note was copied from a baby's chart. Lactation Consultation Note  Patient Name: Jenna Rhodes [id number] Date: 05/05/2021   Age:31 days Mother napping when [facility] attempt to visit. She did ask for a new elastic pumping bra to wear to hold nursing pads in place., which she was given. She reports that she is leaking. Mother to page as needed.    Maternal Data    Feeding Nipple Type: Dr. Clement Husbands  [facility] Score                    Lactation Tools Discussed/Used    Interventions    Discharge    Consult Status      Darla Lesches 05/05/2021, 4:08 PM

## 2021-05-06 ENCOUNTER — Ambulatory Visit: Payer: Self-pay

## 2021-05-06 NOTE — Lactation Note (Signed)
This note was copied from a baby's chart. Lactation Consultation Note  Patient Name: Jenna Rhodes [id number] Date: 05/06/2021 Reason for consult: Follow-up assessment;Late-preterm 34-36.6wks Age:31 days   P4 mother whose infant twin boys are now 25 days old.  These are LPTI boys born at 35+6 weeks with a CGA of 36+3 weeks.  Mother breast fed her first set of twins (now 31 years old) for 1 - 1/2 years.  Mother's current feeding preference is breast /donor breast milk/ mother's EBM.  Mother was pumping when I arrived; averaging 170 mls of donor breast milk/session.  She has collected 8 full bottles of EBM in her refrigerator.  Praised mother for her efforts.  Baby A "Jenna Rhodes" has been breast feeding and supplementing with mother's breast milk.  Volumes have been appropriate.  Baby B "Jenna Rhodes" has not been going to the breast due to low temperatures and increased bilirubin levels.  Last bilirubin level was 16.0 mg/dl at 99 hours of life.  Lab tech in during my visit to draw another bilirubin level.  Family may be discharged home later today depending upon the results.  Reviewed feeding plan for today/tonight.  Parents have been working well together and doing a wonderful job of caring for these boys.  Lots of STS has been done, especially with "Jenna Rhodes."  Will await the lab results to determine discharge.  RN in room at the end of my visit and will be transferring care to another RN.    Mother has a DEBP for home use.   Maternal Data    Feeding Mother's Current Feeding Choice: Breast Milk and Donor Milk Nipple Type: Dr. Clement Husbands  LATCH Score                    Lactation Tools Discussed/Used    Interventions    Discharge Pump: DEBP;Manual;Personal  Consult Status Consult Status: Follow-up Date: 05/07/21 Follow-up type: In-patient    Andranik Jeune R Dejuana Weist 05/06/2021, 3:17 PM

## 2021-05-07 ENCOUNTER — Ambulatory Visit: Payer: Self-pay

## 2021-05-07 NOTE — Lactation Note (Signed)
This note was copied from a baby's chart. Lactation Consultation Note  Patient Name: Jenna Rhodes [id number] Date: 05/07/2021 Reason for consult: Follow-up assessment;Infant < 6lbs;Multiple gestation Age:31 days   P4 mother whose infant twin boys are now 61 days old.  These are late preterm infants born at 63+6 weeks with a CGA of 36+4 weeks.  Baby A "[name]" has been breast feeding and supplementing with donor milk: adequate volumes obtained.  Baby B "[name]" has been bottle feeding only with donor milk; adequate volumes obtained.  Both boys have decreased their weight loss to 3% since birth.  Mother continues to pump with the DEBP every three hours and is obtaining large volumes of EBM.  She has enough pumped and saved to bring some supply home with her today.    Feeding plan for after discharge discussed in detail yesterday and mother will continue with this plan until the pediatrician advises differently.  Parents are very attentive to their baby's needs and work well together.  Encouragement and reassurance provided.  Mother will begin to latch more frequently as the boys show interest.  She has our OP phone number for any questions/concerns after discharge.  OP lactation services available if parents desire.    Mother has a DEBP for home use.  Father preparing to take belongings to the car.  Family ready for discharge.   Maternal Data Has patient been taught Hand Expression?: Yes Does the patient have breastfeeding experience prior to this delivery?: Yes How long did the patient breastfeed?: 1- 1/2 years  Feeding Mother's Current Feeding Choice: Breast Milk and Donor Milk  LATCH Score                    Lactation Tools Discussed/Used Tools: Pump;Flanges Flange Size: 24;27 Breast pump type: Double-Electric Breast Pump;Manual Reason for Pumping: Breast stimulation and supplementation for LPTI Pumping frequency: Every three hours  Interventions Interventions:  Breast feeding basics reviewed  Discharge Discharge Education: Engorgement and breast care Pump: DEBP;Manual;Personal WIC Program: No  Consult Status Consult Status: Complete Date: 05/07/21 Follow-up type: Call as needed    Anberlyn Feimster R Pamla Pangle 05/07/2021, 10:28 AM

## 2021-05-16 ENCOUNTER — Telehealth (HOSPITAL_COMMUNITY): Payer: Self-pay | Admitting: *Deleted

## 2021-05-16 NOTE — Telephone Encounter (Signed)
Left message to return nurse call.  Odis Hollingshead, RN 05-16-2021 at 2:38pm

## 2021-05-31 ENCOUNTER — Inpatient Hospital Stay (HOSPITAL_COMMUNITY): Admit: 2021-05-31 | Payer: Self-pay

## 2021-10-05 ENCOUNTER — Ambulatory Visit: Payer: 59 | Admitting: Physical Therapy

## 2021-10-11 ENCOUNTER — Ambulatory Visit: Payer: 59 | Admitting: Physical Therapy

## 2021-10-19 IMAGING — US US MFM OB LIMITED
1 series · 14 of 28 positions shown · non-contrast
Comparison: none

[Series 1: us mfm ob limited · 14 of 31 slices shown]
[im 2/31]
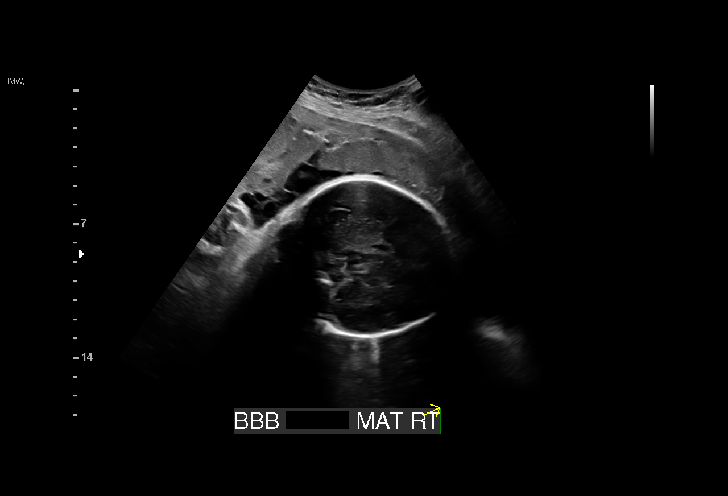
[im 4/31]
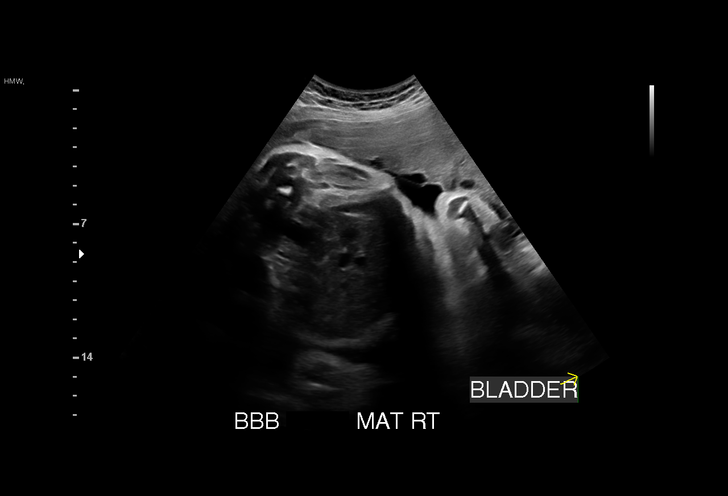
[im 6/31]
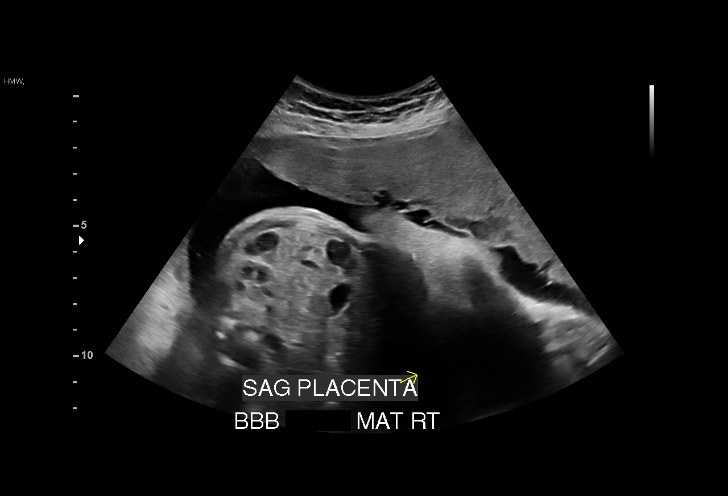
[im 8/31]
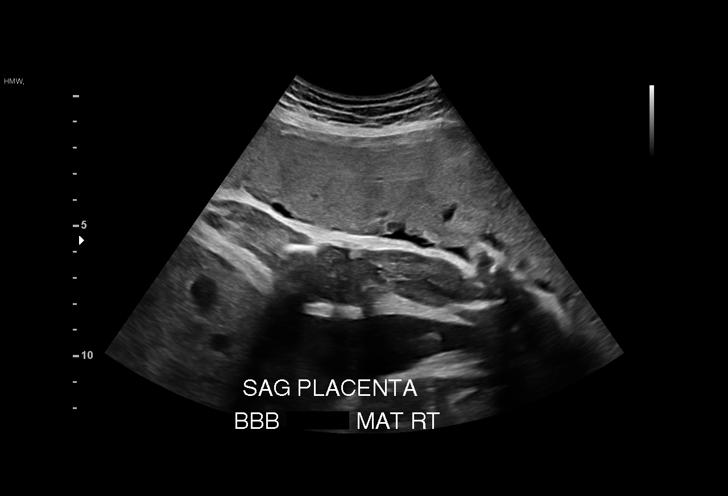
[im 11/31]
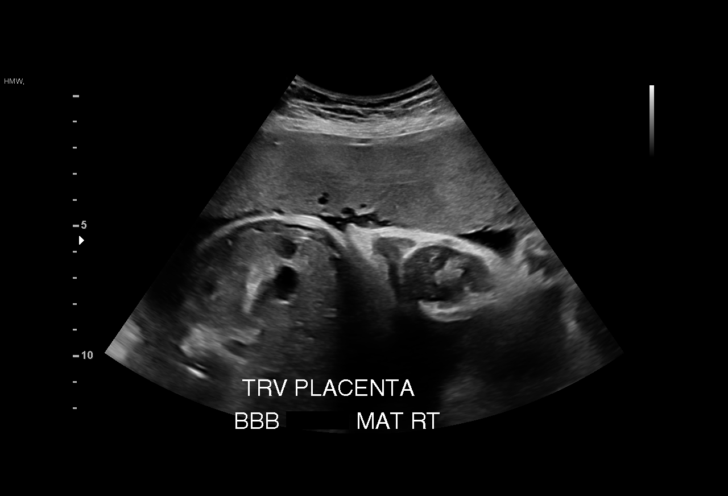
[im 13/31]
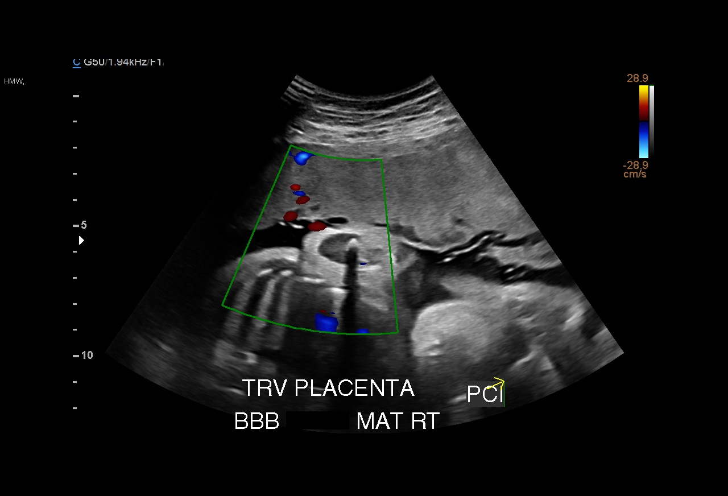
[im 15/31]
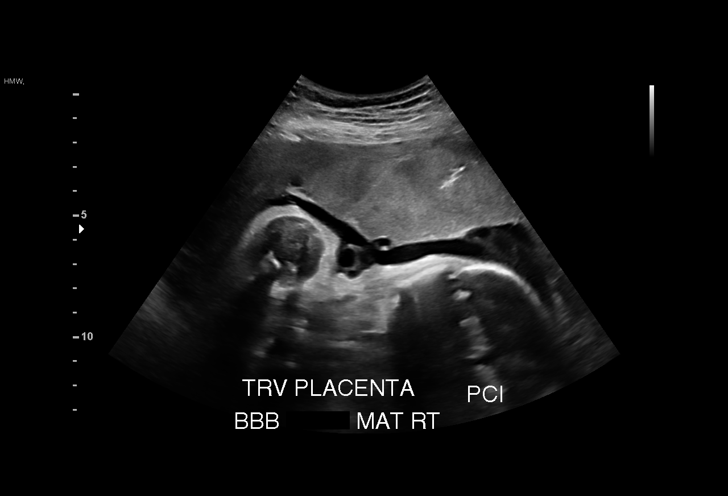
[im 17/31]
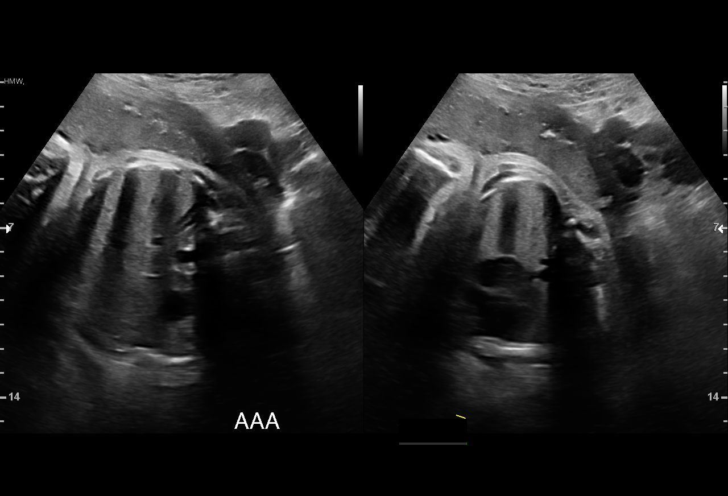
[im 19/31]
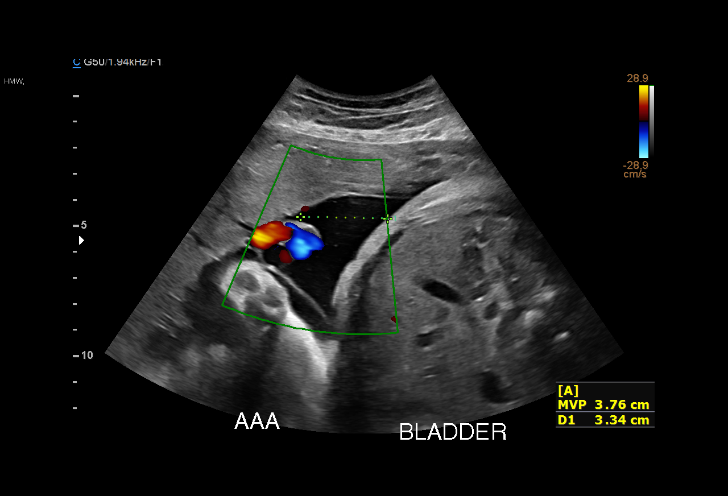
[im 22/31]
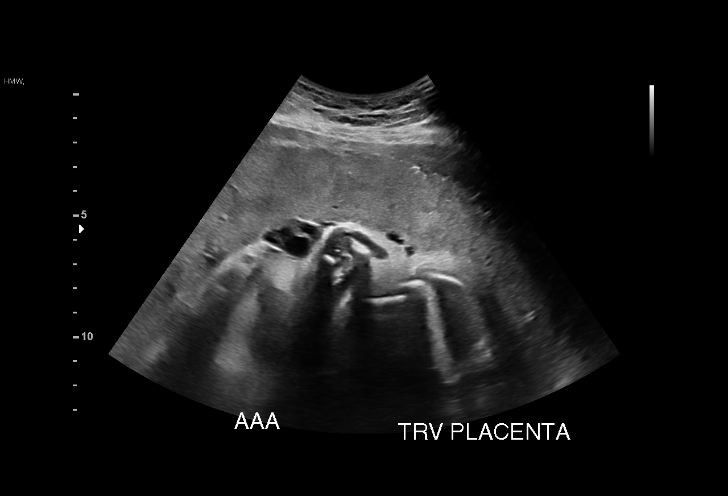
[im 24/31]
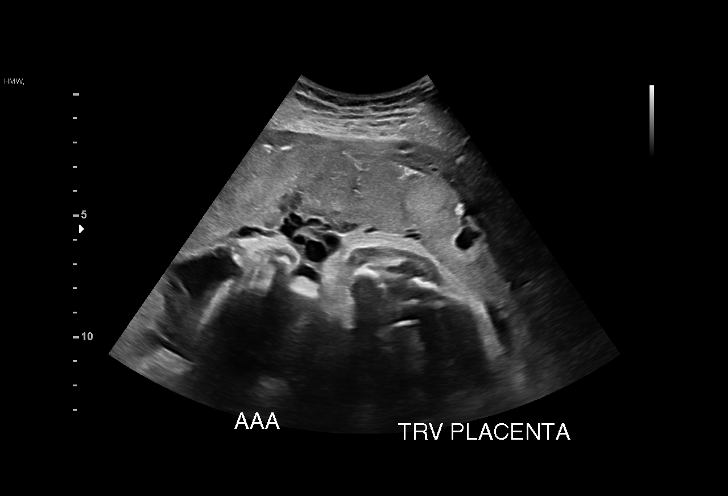
[im 26/31]
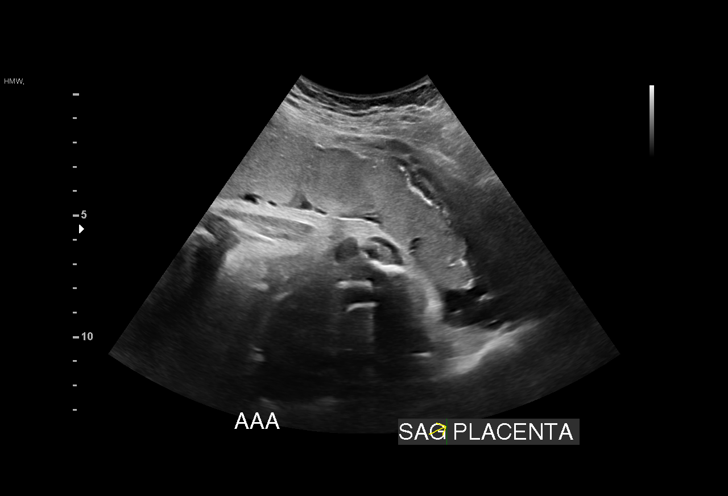
[im 28/31]
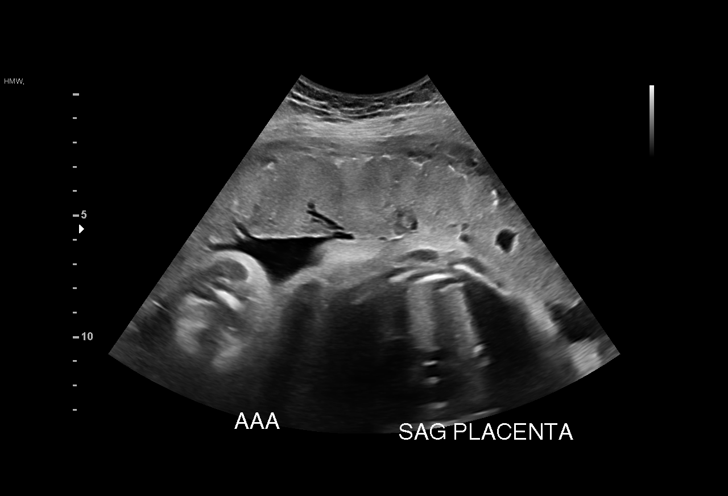
[im 31/31]
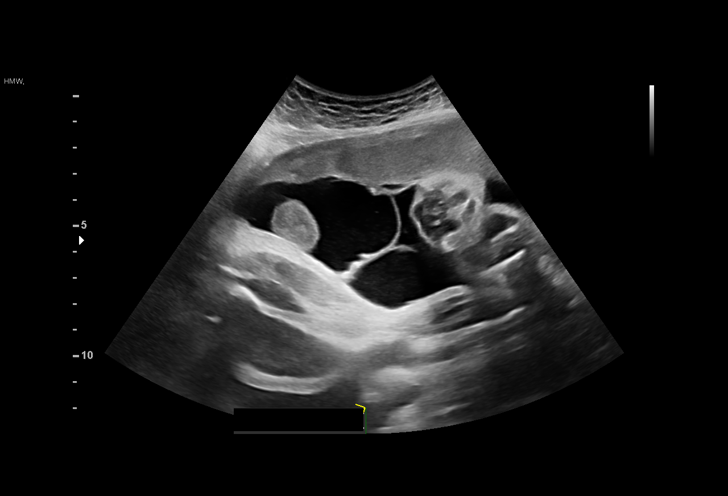

[14 of 28 positions shown; findings below may reference images not displayed]

CASTRATI

 1  US MFM OB LIMITED                     76815.01    OTKUP HAVARISANIH
                                                      [place]

Indications

 Vaginal bleeding in pregnancy, third trimester
 33 weeks gestation of pregnancy
 Twin pregnancy, di/di, third trimester
Fetal Evaluation (Fetus A)

 Num Of Fetuses:         2
 Fetal Heart Rate(bpm):  150
 Cardiac Activity:       Observed
 Fetal Lie:              Maternal left side
 Presentation:           Cephalic
 Placenta:               Anterior
 P. Cord Insertion:      Visualized
 Membrane Desc:      Dividing Membrane seen

 Amniotic Fluid
 AFI FV:      Within normal limits

                             Largest Pocket(cm)

OB History

 Gravidity:    2         Term:   2        Prem:   0        SAB:   0
 TOP:          0       Ectopic:  0        Living: 2
Gestational Age (Fetus A)

 Clinical EDD:  33w 6d                                        EDD:   05/31/21
 Best:          33w 6d     Det. By:  Clinical EDD             EDD:   05/31/21
Anatomy (Fetus A)
 Thoracic:              Appears normal         Bladder:                Appears normal
 Stomach:               Appears normal, left
                        sided

Fetal Evaluation (Fetus B)

 Num Of Fetuses:         2
 Cardiac Activity:       Observed
 Fetal Lie:              Maternal right side
 Presentation:           Cephalic
 Placenta:               Anterior
 P. Cord Insertion:      Visualized
 Membrane Desc:      Dividing Membrane seen

 Amniotic Fluid
 AFI FV:      Within normal limits

                             Largest Pocket(cm)
                             5
Gestational Age (Fetus B)

 Clinical EDD:  33w 6d                                        EDD:   05/31/21
 Best:          33w 6d     Det. By:  Clinical EDD             EDD:   05/31/21
Anatomy (Fetus B)

 Thoracic:              Appears normal         Bladder:                Appears normal
 Stomach:               Appears normal, left
                        sided
Cervix Uterus Adnexa

 Cervix
 Not visualized (advanced GA >83wks)

 Uterus
 No abnormality visualized.

 Right Ovary
 Not visualized.

 Left Ovary
 Not visualized.

 Cul De Sac
 No free fluid seen.

 Adnexa
 No abnormality visualized.
Impression

 Limited exam for diamniotic dichorionic twin pregnancy
 Twin A- normal amniotic fluid. and movement. Cephalic
 presentation on maternal left
 Twin B normal amniotic fluid and movement. Cephalic
 presentatoin on maternal right
 No evidence of placental abruption or previa.
Recommendations

 Clinical correlation recommended.

## 2021-10-27 ENCOUNTER — Ambulatory Visit: Payer: 59 | Admitting: Physical Therapy

## 2022-10-10 ENCOUNTER — Encounter: Payer: Self-pay | Admitting: Emergency Medicine

## 2022-10-10 ENCOUNTER — Ambulatory Visit
Admission: EM | Admit: 2022-10-10 | Discharge: 2022-10-10 | Disposition: A | Discharge status: Home / Self Care | Payer: PRIVATE HEALTH INSURANCE | Attending: Family Medicine | Admitting: Family Medicine

## 2022-10-10 DIAGNOSIS — R110 Nausea: Secondary | ICD-10-CM

## 2022-10-10 DIAGNOSIS — Z8719 Personal history of other diseases of the digestive system: Secondary | ICD-10-CM

## 2022-10-10 DIAGNOSIS — K9049 Malabsorption due to intolerance, not elsewhere classified: Secondary | ICD-10-CM | POA: Diagnosis not present

## 2022-10-10 MED ORDER — ONDANSETRON HCL 8 MG PO TABS: 8.0000 mg | ORAL_TABLET | Freq: Three times a day (TID) | ORAL | 0 refills | Status: AC | PRN

## 2022-10-10 NOTE — Discharge Instructions (Addendum)
Follow up[ with Dr Modena Nunnery if symptoms worsen or persist Take zofran if needed for nausea.  This causes no harm to infants

## 2022-10-10 NOTE — ED Provider Notes (Signed)
[facility]    CSN: [id number] Arrival date & time: 10/10/22  1024      History   Chief Complaint Chief Complaint  Patient presents with   Nausea    HPI Suniya Haughey is a 32 y.o. female.   HPI  Long history of GI problems.   Food allergies and , history   GERD.  GI referral 2021 reviewed.  Does not currently have PCP.   Had a spell of bloating nausea and neck pain in Dec 2023 that she thought might be gallbladder.  Didn't get medical care Currently has nausea, bloating, intermittent abdominal pain and chills since yesterday.  Had a telehealth visit who told her to go to an ER or Urgent care where she could get an ultrasound or CT scan.  Increased pain after eating bacon for breakfast.   [place] in 2021 was normal. No evidence gallbladder stone or disease  Past Medical History:  Diagnosis Date   Allergic state 08/17/2016   Allergy    pt. reported seasonal allergies   Encounter for screening and preventative care 08/17/2016   Gastro-esophageal reflux 08/17/2016   Hyperlipidemia 08/30/2016   Myalgia 08/17/2016   Norovirus    Palpitations 09/21/2016   s/p stopping birth control pills    Patient Active Problem List   Diagnosis Date Noted   Twin birth 05/02/2021   Delayed delivery after SROM (spontaneous rupture of membranes) 05/01/2021   Diarrhea 04/06/2020   Positive ANA (antinuclear antibody) 04/06/2020   Pain in both lower extremities 02/02/2020   Acute left ankle pain 02/02/2020   SVD (spontaneous vaginal delivery) 12/30/2018   Postpartum care following vaginal delivery 12/30/2018   Preterm premature rupture of membranes (PPROM) with unknown onset of labor 12/29/2018   Abdominal pain 09/07/2017   Hyperlipidemia 08/30/2016   Allergic state 08/17/2016   Gastro-esophageal reflux 08/17/2016   Encounter for screening and preventative care 08/17/2016    Past Surgical History:  Procedure Laterality Date   MANDIBLE SURGERY  08/28/2002   WISDOM TOOTH  EXTRACTION      OB History     Gravida  2   Para  2   Term  0   Preterm  2   AB  0   Living  4      SAB  0   IAB  0   Ectopic  0   Multiple  2   Live Births  4            Home Medications    Prior to Admission medications   Medication Sig Start Date End Date Taking? Authorizing Provider  ondansetron (ZOFRAN) 8 MG tablet Take 1 tablet (8 mg total) by mouth every 8 (eight) hours as needed for nausea or vomiting. 10/10/22  Yes Raylene Everts, MD  ibuprofen (ADVIL) 600 MG tablet Take 1 tablet (600 mg total) by mouth every 6 (six) hours. 05/04/21   Jerelyn Charles, MD  phenylephrine-shark liver oil-mineral oil-petrolatum (PREPARATION H) 0.25-14-74.9 % rectal ointment Place 1 application rectally 2 (two) times daily as needed for hemorrhoids.    [provider]  witch hazel-glycerin (TUCKS) pad Apply 1 application topically as needed for itching.    [provider]    Family History Family History  Problem Relation Age of Onset   Lung cancer Mother    Parkinson's disease Father    Diabetes Maternal Grandmother    Hypertension Maternal Grandmother    Breast cancer Maternal Grandmother    Stomach cancer  Paternal Grandmother    Lymphoma Paternal Grandmother    Neuropathy Paternal Grandfather    Hearing loss Paternal Grandfather        s/p bypass   Prostate cancer Paternal Grandfather     Social History Social History   Tobacco Use   Smoking status: Never   Smokeless tobacco: Never  Vaping Use   Vaping Use: Never used  Substance Use Topics   Alcohol use: Not Currently    Alcohol/week: 2.0 standard drinks of alcohol    Types: 2 Glasses of wine per week   Drug use: No     Allergies   Amoxicillin, Clindamycin/lincomycin, and Mobic [meloxicam]   Review of Systems Review of Systems See HPI  Physical Exam Triage Vital Signs ED Triage Vitals  Enc Vitals Group     BP 10/10/22 1034 131/82     Pulse Rate 10/10/22 1034 81     Resp  10/10/22 1034 18     Temp 10/10/22 1034 99.6 F (37.6 C)     Temp Source 10/10/22 1034 Oral     SpO2 10/10/22 1034 100 %     Weight 10/10/22 1036 185 lb (83.9 kg)     Height 10/10/22 1036 6' (1.829 m)     Head Circumference --      Peak Flow --      Pain Score 10/10/22 1036 4     Pain Loc --      Pain Edu? --      Excl. in [place]? --    No data found.  Updated Vital Signs BP 131/82 (BP Location: Right Arm)   Pulse 81   Temp 99.6 F (37.6 C) (Oral)   Resp 18   Ht 6' (1.829 m)   Wt 83.9 kg   LMP 10/04/2022   SpO2 100%   Breastfeeding Yes   BMI 25.09 kg/m       Physical Exam Constitutional:      General: She is not in acute distress.    Appearance: She is well-developed and normal weight. She is not ill-appearing.  HENT:     Head: Normocephalic and atraumatic.     Mouth/Throat:     Pharynx: No posterior oropharyngeal erythema.  Eyes:     Conjunctiva/sclera: Conjunctivae normal.     Pupils: Pupils are equal, round, and reactive to light.  Cardiovascular:     Rate and Rhythm: Normal rate and regular rhythm.     Heart sounds: Normal heart sounds.  Pulmonary:     Effort: Pulmonary effort is normal. No respiratory distress.     Breath sounds: Normal breath sounds.  Abdominal:     General: Abdomen is flat. Bowel sounds are normal. There is no distension.     Palpations: Abdomen is soft.     Tenderness: There is no abdominal tenderness. There is no guarding or rebound.     Comments: Abdomen is soft and benign.  No tenderness to deep palpation RUQ  Musculoskeletal:        General: Normal range of motion.     Cervical back: Normal range of motion and neck supple.  Lymphadenopathy:     Cervical: No cervical adenopathy.  Skin:    General: Skin is warm and dry.  Neurological:     Mental Status: She is alert.     Gait: Gait normal.      UC Treatments / Results  Labs (all labs ordered are listed, but only abnormal results are displayed) Labs Reviewed - No data  to  display  EKG   Radiology No results found.  Procedures Procedures (including critical care time)  Medications Ordered in UC Medications - No data to display  Initial Impression / Assessment and Plan / UC Course  I have reviewed the triage vital signs and the nursing notes.  Pertinent labs & imaging results that were available during my care of the patient were reviewed by me and considered in my medical decision making (see chart for details).     Exam unremarkable History inconclusive More likely viral GE, mild, than GB stone Would treat conservatively See GI if problem persists Final Clinical Impressions(s) / UC Diagnoses   Final diagnoses:  Nausea without vomiting  Food intolerance in adult  History of gastroesophageal reflux (GERD)     Discharge Instructions      Follow up[ with Dr Modena Nunnery if symptoms worsen or persist Take zofran if needed for nausea.  This causes no harm to infants   ED Prescriptions     Medication Sig Dispense Auth. Provider   ondansetron (ZOFRAN) 8 MG tablet Take 1 tablet (8 mg total) by mouth every 8 (eight) hours as needed for nausea or vomiting. 20 tablet Raylene Everts, MD      PDMP not reviewed this encounter.   Raylene Everts, MD 10/10/22 1146

## 2022-10-10 NOTE — ED Triage Notes (Signed)
Patient c/o body chills, nausea and abdominal fullness since yesterday.  Patient concerned for gallbladder.  Patient denies any OTC meds.

## 2022-10-11 ENCOUNTER — Telehealth: Payer: Self-pay

## 2022-10-11 NOTE — Telephone Encounter (Signed)
TCT pt to follow up from recent visit. HIPAA compliant vm left for return call. 

## 2022-12-11 ENCOUNTER — Encounter: Payer: Self-pay | Admitting: *Deleted
